# Patient Record
Sex: Female | Born: 1950
Health system: Southern US, Community
[De-identification: ages and names within clinical notes are randomized; demographics above are authoritative.]

## PROBLEM LIST (undated history)

## (undated) DIAGNOSIS — D352 Benign neoplasm of pituitary gland: Secondary | ICD-10-CM

## (undated) DIAGNOSIS — D649 Anemia, unspecified: Secondary | ICD-10-CM

## (undated) DIAGNOSIS — E785 Hyperlipidemia, unspecified: Secondary | ICD-10-CM

## (undated) DIAGNOSIS — F419 Anxiety disorder, unspecified: Secondary | ICD-10-CM

## (undated) DIAGNOSIS — G2581 Restless legs syndrome: Secondary | ICD-10-CM

## (undated) DIAGNOSIS — Z860100 Personal history of colon polyps, unspecified: Secondary | ICD-10-CM

## (undated) DIAGNOSIS — K219 Gastro-esophageal reflux disease without esophagitis: Secondary | ICD-10-CM

## (undated) DIAGNOSIS — J309 Allergic rhinitis, unspecified: Secondary | ICD-10-CM

## (undated) DIAGNOSIS — Z8371 Family history of colonic polyps: Secondary | ICD-10-CM

## (undated) DIAGNOSIS — J449 Chronic obstructive pulmonary disease, unspecified: Secondary | ICD-10-CM

## (undated) DIAGNOSIS — Z83719 Family history of colon polyps, unspecified: Secondary | ICD-10-CM

## (undated) DIAGNOSIS — K5090 Crohn's disease, unspecified, without complications: Secondary | ICD-10-CM

## (undated) DIAGNOSIS — Z8619 Personal history of other infectious and parasitic diseases: Secondary | ICD-10-CM

## (undated) DIAGNOSIS — K449 Diaphragmatic hernia without obstruction or gangrene: Secondary | ICD-10-CM

## (undated) DIAGNOSIS — R1319 Other dysphagia: Secondary | ICD-10-CM

## (undated) HISTORY — DX: Chronic obstructive pulmonary disease, unspecified: J44.9

## (undated) HISTORY — DX: Anxiety disorder, unspecified: F41.9

## (undated) HISTORY — PX: TUBAL LIGATION: SHX77

## (undated) HISTORY — DX: Restless legs syndrome: G25.81

## (undated) HISTORY — DX: Anemia, unspecified: D64.9

## (undated) HISTORY — DX: Personal history of colon polyps, unspecified: Z86.0100

## (undated) HISTORY — DX: Family history of colon polyps, unspecified: Z83.719

## (undated) HISTORY — DX: Family history of colonic polyps: Z83.71

## (undated) HISTORY — DX: Hyperlipidemia, unspecified: E78.5

## (undated) HISTORY — PX: TONSILLECTOMY: SUR1361

## (undated) HISTORY — DX: Allergic rhinitis, unspecified: J30.9

## (undated) HISTORY — DX: Benign neoplasm of pituitary gland: D35.2

## (undated) HISTORY — DX: Other dysphagia: R13.19

## (undated) HISTORY — DX: Diaphragmatic hernia without obstruction or gangrene: K44.9

## (undated) HISTORY — DX: Personal history of colonic polyps: Z86.010

## (undated) HISTORY — DX: Gastro-esophageal reflux disease without esophagitis: K21.9

## (undated) HISTORY — PX: GALLBLADDER SURGERY: SHX652

## (undated) HISTORY — DX: Personal history of other infectious and parasitic diseases: Z86.19

## (undated) HISTORY — DX: Crohn's disease, unspecified, without complications: K50.90

---

## 2016-02-29 DIAGNOSIS — J069 Acute upper respiratory infection, unspecified: Secondary | ICD-10-CM | POA: Diagnosis not present

## 2016-02-29 DIAGNOSIS — J449 Chronic obstructive pulmonary disease, unspecified: Secondary | ICD-10-CM | POA: Diagnosis not present

## 2016-02-29 DIAGNOSIS — M810 Age-related osteoporosis without current pathological fracture: Secondary | ICD-10-CM | POA: Diagnosis not present

## 2016-02-29 DIAGNOSIS — J018 Other acute sinusitis: Secondary | ICD-10-CM | POA: Diagnosis not present

## 2016-03-11 DIAGNOSIS — I10 Essential (primary) hypertension: Secondary | ICD-10-CM | POA: Diagnosis not present

## 2016-03-11 DIAGNOSIS — M810 Age-related osteoporosis without current pathological fracture: Secondary | ICD-10-CM | POA: Diagnosis not present

## 2016-03-11 DIAGNOSIS — J449 Chronic obstructive pulmonary disease, unspecified: Secondary | ICD-10-CM | POA: Diagnosis not present

## 2016-03-11 DIAGNOSIS — D518 Other vitamin B12 deficiency anemias: Secondary | ICD-10-CM | POA: Diagnosis not present

## 2016-03-18 DIAGNOSIS — E782 Mixed hyperlipidemia: Secondary | ICD-10-CM | POA: Diagnosis not present

## 2016-03-18 DIAGNOSIS — R7301 Impaired fasting glucose: Secondary | ICD-10-CM | POA: Diagnosis not present

## 2016-03-18 DIAGNOSIS — J069 Acute upper respiratory infection, unspecified: Secondary | ICD-10-CM | POA: Diagnosis not present

## 2016-03-18 DIAGNOSIS — R5383 Other fatigue: Secondary | ICD-10-CM | POA: Diagnosis not present

## 2016-04-21 DIAGNOSIS — Z1211 Encounter for screening for malignant neoplasm of colon: Secondary | ICD-10-CM | POA: Diagnosis not present

## 2016-04-21 DIAGNOSIS — Z86010 Personal history of colonic polyps: Secondary | ICD-10-CM | POA: Diagnosis not present

## 2016-04-21 DIAGNOSIS — R1310 Dysphagia, unspecified: Secondary | ICD-10-CM | POA: Diagnosis not present

## 2016-04-21 DIAGNOSIS — K219 Gastro-esophageal reflux disease without esophagitis: Secondary | ICD-10-CM | POA: Diagnosis not present

## 2016-04-28 DIAGNOSIS — K449 Diaphragmatic hernia without obstruction or gangrene: Secondary | ICD-10-CM | POA: Diagnosis not present

## 2016-04-28 DIAGNOSIS — R1310 Dysphagia, unspecified: Secondary | ICD-10-CM | POA: Diagnosis not present

## 2016-05-26 DIAGNOSIS — I10 Essential (primary) hypertension: Secondary | ICD-10-CM | POA: Diagnosis not present

## 2016-05-26 DIAGNOSIS — M810 Age-related osteoporosis without current pathological fracture: Secondary | ICD-10-CM | POA: Diagnosis not present

## 2016-05-26 DIAGNOSIS — D518 Other vitamin B12 deficiency anemias: Secondary | ICD-10-CM | POA: Diagnosis not present

## 2016-05-26 DIAGNOSIS — J449 Chronic obstructive pulmonary disease, unspecified: Secondary | ICD-10-CM | POA: Diagnosis not present

## 2016-06-02 DIAGNOSIS — M810 Age-related osteoporosis without current pathological fracture: Secondary | ICD-10-CM | POA: Diagnosis not present

## 2016-06-02 DIAGNOSIS — E119 Type 2 diabetes mellitus without complications: Secondary | ICD-10-CM | POA: Diagnosis not present

## 2016-06-02 DIAGNOSIS — J449 Chronic obstructive pulmonary disease, unspecified: Secondary | ICD-10-CM | POA: Diagnosis not present

## 2016-06-02 DIAGNOSIS — R0602 Shortness of breath: Secondary | ICD-10-CM | POA: Diagnosis not present

## 2016-06-02 DIAGNOSIS — I10 Essential (primary) hypertension: Secondary | ICD-10-CM | POA: Diagnosis not present

## 2016-06-02 DIAGNOSIS — E038 Other specified hypothyroidism: Secondary | ICD-10-CM | POA: Diagnosis not present

## 2016-06-02 DIAGNOSIS — D518 Other vitamin B12 deficiency anemias: Secondary | ICD-10-CM | POA: Diagnosis not present

## 2016-06-02 DIAGNOSIS — E559 Vitamin D deficiency, unspecified: Secondary | ICD-10-CM | POA: Diagnosis not present

## 2016-06-02 DIAGNOSIS — E782 Mixed hyperlipidemia: Secondary | ICD-10-CM | POA: Diagnosis not present

## 2016-06-24 DIAGNOSIS — I10 Essential (primary) hypertension: Secondary | ICD-10-CM | POA: Diagnosis not present

## 2016-06-24 DIAGNOSIS — D518 Other vitamin B12 deficiency anemias: Secondary | ICD-10-CM | POA: Diagnosis not present

## 2016-06-24 DIAGNOSIS — J449 Chronic obstructive pulmonary disease, unspecified: Secondary | ICD-10-CM | POA: Diagnosis not present

## 2016-06-24 DIAGNOSIS — M810 Age-related osteoporosis without current pathological fracture: Secondary | ICD-10-CM | POA: Diagnosis not present

## 2016-07-02 DIAGNOSIS — R69 Illness, unspecified: Secondary | ICD-10-CM | POA: Diagnosis not present

## 2016-07-02 DIAGNOSIS — M810 Age-related osteoporosis without current pathological fracture: Secondary | ICD-10-CM | POA: Diagnosis not present

## 2016-07-02 DIAGNOSIS — Z7951 Long term (current) use of inhaled steroids: Secondary | ICD-10-CM | POA: Diagnosis not present

## 2016-07-02 DIAGNOSIS — K219 Gastro-esophageal reflux disease without esophagitis: Secondary | ICD-10-CM | POA: Diagnosis not present

## 2016-07-02 DIAGNOSIS — Z87891 Personal history of nicotine dependence: Secondary | ICD-10-CM | POA: Diagnosis not present

## 2016-07-02 DIAGNOSIS — Z0000 Encounter for general adult medical examination without abnormal findings: Secondary | ICD-10-CM | POA: Diagnosis not present

## 2016-07-02 DIAGNOSIS — R233 Spontaneous ecchymoses: Secondary | ICD-10-CM | POA: Diagnosis not present

## 2016-07-02 DIAGNOSIS — H5462 Unqualified visual loss, left eye, normal vision right eye: Secondary | ICD-10-CM | POA: Diagnosis not present

## 2016-07-02 DIAGNOSIS — Z681 Body mass index (BMI) 19 or less, adult: Secondary | ICD-10-CM | POA: Diagnosis not present

## 2016-07-02 DIAGNOSIS — J449 Chronic obstructive pulmonary disease, unspecified: Secondary | ICD-10-CM | POA: Diagnosis not present

## 2016-07-02 DIAGNOSIS — Z9049 Acquired absence of other specified parts of digestive tract: Secondary | ICD-10-CM | POA: Diagnosis not present

## 2016-07-02 DIAGNOSIS — M791 Myalgia: Secondary | ICD-10-CM | POA: Diagnosis not present

## 2016-07-13 DIAGNOSIS — Z9181 History of falling: Secondary | ICD-10-CM | POA: Diagnosis not present

## 2016-07-13 DIAGNOSIS — M810 Age-related osteoporosis without current pathological fracture: Secondary | ICD-10-CM | POA: Diagnosis not present

## 2016-07-13 DIAGNOSIS — Z681 Body mass index (BMI) 19 or less, adult: Secondary | ICD-10-CM | POA: Diagnosis not present

## 2016-07-13 DIAGNOSIS — J432 Centrilobular emphysema: Secondary | ICD-10-CM | POA: Diagnosis not present

## 2016-07-13 DIAGNOSIS — Z1389 Encounter for screening for other disorder: Secondary | ICD-10-CM | POA: Diagnosis not present

## 2016-07-13 DIAGNOSIS — R69 Illness, unspecified: Secondary | ICD-10-CM | POA: Diagnosis not present

## 2016-07-13 DIAGNOSIS — K219 Gastro-esophageal reflux disease without esophagitis: Secondary | ICD-10-CM | POA: Diagnosis not present

## 2016-07-21 DIAGNOSIS — E785 Hyperlipidemia, unspecified: Secondary | ICD-10-CM | POA: Diagnosis not present

## 2016-07-21 DIAGNOSIS — R1310 Dysphagia, unspecified: Secondary | ICD-10-CM | POA: Diagnosis not present

## 2016-07-21 DIAGNOSIS — K5090 Crohn's disease, unspecified, without complications: Secondary | ICD-10-CM | POA: Diagnosis not present

## 2016-07-21 DIAGNOSIS — K225 Diverticulum of esophagus, acquired: Secondary | ICD-10-CM | POA: Diagnosis not present

## 2016-07-21 DIAGNOSIS — K222 Esophageal obstruction: Secondary | ICD-10-CM | POA: Diagnosis not present

## 2016-07-21 DIAGNOSIS — D122 Benign neoplasm of ascending colon: Secondary | ICD-10-CM | POA: Diagnosis not present

## 2016-07-21 DIAGNOSIS — K635 Polyp of colon: Secondary | ICD-10-CM | POA: Diagnosis not present

## 2016-07-21 DIAGNOSIS — J449 Chronic obstructive pulmonary disease, unspecified: Secondary | ICD-10-CM | POA: Diagnosis not present

## 2016-07-21 DIAGNOSIS — K648 Other hemorrhoids: Secondary | ICD-10-CM | POA: Diagnosis not present

## 2016-07-21 DIAGNOSIS — K5710 Diverticulosis of small intestine without perforation or abscess without bleeding: Secondary | ICD-10-CM | POA: Diagnosis not present

## 2016-07-21 DIAGNOSIS — Z86010 Personal history of colonic polyps: Secondary | ICD-10-CM | POA: Diagnosis not present

## 2016-07-21 DIAGNOSIS — Z1211 Encounter for screening for malignant neoplasm of colon: Secondary | ICD-10-CM | POA: Diagnosis not present

## 2016-07-21 DIAGNOSIS — K449 Diaphragmatic hernia without obstruction or gangrene: Secondary | ICD-10-CM | POA: Diagnosis not present

## 2016-07-21 DIAGNOSIS — K219 Gastro-esophageal reflux disease without esophagitis: Secondary | ICD-10-CM | POA: Diagnosis not present

## 2016-07-21 DIAGNOSIS — K529 Noninfective gastroenteritis and colitis, unspecified: Secondary | ICD-10-CM | POA: Diagnosis not present

## 2016-07-21 DIAGNOSIS — K5000 Crohn's disease of small intestine without complications: Secondary | ICD-10-CM | POA: Diagnosis not present

## 2016-07-21 HISTORY — PX: COLONOSCOPY: SHX174

## 2016-07-21 HISTORY — PX: ESOPHAGOGASTRODUODENOSCOPY: SHX1529

## 2016-07-27 DIAGNOSIS — J449 Chronic obstructive pulmonary disease, unspecified: Secondary | ICD-10-CM | POA: Diagnosis not present

## 2016-07-27 DIAGNOSIS — D518 Other vitamin B12 deficiency anemias: Secondary | ICD-10-CM | POA: Diagnosis not present

## 2016-07-27 DIAGNOSIS — I10 Essential (primary) hypertension: Secondary | ICD-10-CM | POA: Diagnosis not present

## 2016-07-27 DIAGNOSIS — M810 Age-related osteoporosis without current pathological fracture: Secondary | ICD-10-CM | POA: Diagnosis not present

## 2016-08-11 DIAGNOSIS — Z0000 Encounter for general adult medical examination without abnormal findings: Secondary | ICD-10-CM | POA: Diagnosis not present

## 2016-08-11 DIAGNOSIS — R69 Illness, unspecified: Secondary | ICD-10-CM | POA: Diagnosis not present

## 2016-08-11 DIAGNOSIS — Z6820 Body mass index (BMI) 20.0-20.9, adult: Secondary | ICD-10-CM | POA: Diagnosis not present

## 2016-08-11 DIAGNOSIS — K56699 Other intestinal obstruction unspecified as to partial versus complete obstruction: Secondary | ICD-10-CM | POA: Diagnosis not present

## 2016-08-11 DIAGNOSIS — M810 Age-related osteoporosis without current pathological fracture: Secondary | ICD-10-CM | POA: Diagnosis not present

## 2016-08-11 DIAGNOSIS — K219 Gastro-esophageal reflux disease without esophagitis: Secondary | ICD-10-CM | POA: Diagnosis not present

## 2016-08-11 DIAGNOSIS — K449 Diaphragmatic hernia without obstruction or gangrene: Secondary | ICD-10-CM | POA: Diagnosis not present

## 2016-08-11 DIAGNOSIS — J432 Centrilobular emphysema: Secondary | ICD-10-CM | POA: Diagnosis not present

## 2016-08-11 DIAGNOSIS — Z780 Asymptomatic menopausal state: Secondary | ICD-10-CM | POA: Diagnosis not present

## 2016-08-25 DIAGNOSIS — K5090 Crohn's disease, unspecified, without complications: Secondary | ICD-10-CM | POA: Diagnosis not present

## 2016-09-12 DIAGNOSIS — K50018 Crohn's disease of small intestine with other complication: Secondary | ICD-10-CM | POA: Insufficient documentation

## 2016-11-12 DIAGNOSIS — R69 Illness, unspecified: Secondary | ICD-10-CM | POA: Diagnosis not present

## 2016-11-17 DIAGNOSIS — M5442 Lumbago with sciatica, left side: Secondary | ICD-10-CM | POA: Diagnosis not present

## 2016-11-17 DIAGNOSIS — M62830 Muscle spasm of back: Secondary | ICD-10-CM | POA: Diagnosis not present

## 2016-11-17 DIAGNOSIS — M5137 Other intervertebral disc degeneration, lumbosacral region: Secondary | ICD-10-CM | POA: Diagnosis not present

## 2016-11-17 DIAGNOSIS — H5203 Hypermetropia, bilateral: Secondary | ICD-10-CM | POA: Diagnosis not present

## 2016-11-17 DIAGNOSIS — M9903 Segmental and somatic dysfunction of lumbar region: Secondary | ICD-10-CM | POA: Diagnosis not present

## 2016-11-17 DIAGNOSIS — H524 Presbyopia: Secondary | ICD-10-CM | POA: Diagnosis not present

## 2016-11-17 DIAGNOSIS — H52209 Unspecified astigmatism, unspecified eye: Secondary | ICD-10-CM | POA: Diagnosis not present

## 2016-11-17 DIAGNOSIS — M5136 Other intervertebral disc degeneration, lumbar region: Secondary | ICD-10-CM | POA: Diagnosis not present

## 2016-11-17 DIAGNOSIS — M9902 Segmental and somatic dysfunction of thoracic region: Secondary | ICD-10-CM | POA: Diagnosis not present

## 2016-11-24 DIAGNOSIS — M9902 Segmental and somatic dysfunction of thoracic region: Secondary | ICD-10-CM | POA: Diagnosis not present

## 2016-11-24 DIAGNOSIS — M9903 Segmental and somatic dysfunction of lumbar region: Secondary | ICD-10-CM | POA: Diagnosis not present

## 2016-11-24 DIAGNOSIS — M5137 Other intervertebral disc degeneration, lumbosacral region: Secondary | ICD-10-CM | POA: Diagnosis not present

## 2016-11-24 DIAGNOSIS — M62830 Muscle spasm of back: Secondary | ICD-10-CM | POA: Diagnosis not present

## 2016-11-24 DIAGNOSIS — M5442 Lumbago with sciatica, left side: Secondary | ICD-10-CM | POA: Diagnosis not present

## 2016-11-24 DIAGNOSIS — M5136 Other intervertebral disc degeneration, lumbar region: Secondary | ICD-10-CM | POA: Diagnosis not present

## 2016-11-28 DIAGNOSIS — M62830 Muscle spasm of back: Secondary | ICD-10-CM | POA: Diagnosis not present

## 2016-11-28 DIAGNOSIS — M9903 Segmental and somatic dysfunction of lumbar region: Secondary | ICD-10-CM | POA: Diagnosis not present

## 2016-11-28 DIAGNOSIS — M5136 Other intervertebral disc degeneration, lumbar region: Secondary | ICD-10-CM | POA: Diagnosis not present

## 2016-11-28 DIAGNOSIS — M5137 Other intervertebral disc degeneration, lumbosacral region: Secondary | ICD-10-CM | POA: Diagnosis not present

## 2016-11-28 DIAGNOSIS — M5442 Lumbago with sciatica, left side: Secondary | ICD-10-CM | POA: Diagnosis not present

## 2016-11-28 DIAGNOSIS — M9902 Segmental and somatic dysfunction of thoracic region: Secondary | ICD-10-CM | POA: Diagnosis not present

## 2016-11-29 DIAGNOSIS — M5442 Lumbago with sciatica, left side: Secondary | ICD-10-CM | POA: Diagnosis not present

## 2016-11-29 DIAGNOSIS — M5136 Other intervertebral disc degeneration, lumbar region: Secondary | ICD-10-CM | POA: Diagnosis not present

## 2016-11-29 DIAGNOSIS — M9903 Segmental and somatic dysfunction of lumbar region: Secondary | ICD-10-CM | POA: Diagnosis not present

## 2016-11-29 DIAGNOSIS — M62830 Muscle spasm of back: Secondary | ICD-10-CM | POA: Diagnosis not present

## 2016-11-29 DIAGNOSIS — M9902 Segmental and somatic dysfunction of thoracic region: Secondary | ICD-10-CM | POA: Diagnosis not present

## 2016-11-29 DIAGNOSIS — M5137 Other intervertebral disc degeneration, lumbosacral region: Secondary | ICD-10-CM | POA: Diagnosis not present

## 2016-12-01 DIAGNOSIS — M9903 Segmental and somatic dysfunction of lumbar region: Secondary | ICD-10-CM | POA: Diagnosis not present

## 2016-12-01 DIAGNOSIS — M5442 Lumbago with sciatica, left side: Secondary | ICD-10-CM | POA: Diagnosis not present

## 2016-12-01 DIAGNOSIS — M9902 Segmental and somatic dysfunction of thoracic region: Secondary | ICD-10-CM | POA: Diagnosis not present

## 2016-12-01 DIAGNOSIS — M5136 Other intervertebral disc degeneration, lumbar region: Secondary | ICD-10-CM | POA: Diagnosis not present

## 2016-12-01 DIAGNOSIS — M5137 Other intervertebral disc degeneration, lumbosacral region: Secondary | ICD-10-CM | POA: Diagnosis not present

## 2016-12-01 DIAGNOSIS — M62830 Muscle spasm of back: Secondary | ICD-10-CM | POA: Diagnosis not present

## 2016-12-02 DIAGNOSIS — R69 Illness, unspecified: Secondary | ICD-10-CM | POA: Diagnosis not present

## 2016-12-02 DIAGNOSIS — N3281 Overactive bladder: Secondary | ICD-10-CM | POA: Diagnosis not present

## 2016-12-02 DIAGNOSIS — Z139 Encounter for screening, unspecified: Secondary | ICD-10-CM | POA: Diagnosis not present

## 2016-12-02 DIAGNOSIS — K219 Gastro-esophageal reflux disease without esophagitis: Secondary | ICD-10-CM | POA: Diagnosis not present

## 2016-12-02 DIAGNOSIS — Z6820 Body mass index (BMI) 20.0-20.9, adult: Secondary | ICD-10-CM | POA: Diagnosis not present

## 2016-12-02 DIAGNOSIS — M810 Age-related osteoporosis without current pathological fracture: Secondary | ICD-10-CM | POA: Diagnosis not present

## 2016-12-02 DIAGNOSIS — J432 Centrilobular emphysema: Secondary | ICD-10-CM | POA: Diagnosis not present

## 2016-12-05 DIAGNOSIS — M9902 Segmental and somatic dysfunction of thoracic region: Secondary | ICD-10-CM | POA: Diagnosis not present

## 2016-12-05 DIAGNOSIS — M9903 Segmental and somatic dysfunction of lumbar region: Secondary | ICD-10-CM | POA: Diagnosis not present

## 2016-12-05 DIAGNOSIS — M5137 Other intervertebral disc degeneration, lumbosacral region: Secondary | ICD-10-CM | POA: Diagnosis not present

## 2016-12-05 DIAGNOSIS — M5136 Other intervertebral disc degeneration, lumbar region: Secondary | ICD-10-CM | POA: Diagnosis not present

## 2016-12-05 DIAGNOSIS — M62830 Muscle spasm of back: Secondary | ICD-10-CM | POA: Diagnosis not present

## 2016-12-05 DIAGNOSIS — M5442 Lumbago with sciatica, left side: Secondary | ICD-10-CM | POA: Diagnosis not present

## 2016-12-06 DIAGNOSIS — M5442 Lumbago with sciatica, left side: Secondary | ICD-10-CM | POA: Diagnosis not present

## 2016-12-06 DIAGNOSIS — M9902 Segmental and somatic dysfunction of thoracic region: Secondary | ICD-10-CM | POA: Diagnosis not present

## 2016-12-06 DIAGNOSIS — M9903 Segmental and somatic dysfunction of lumbar region: Secondary | ICD-10-CM | POA: Diagnosis not present

## 2016-12-06 DIAGNOSIS — E559 Vitamin D deficiency, unspecified: Secondary | ICD-10-CM | POA: Diagnosis not present

## 2016-12-06 DIAGNOSIS — Z79899 Other long term (current) drug therapy: Secondary | ICD-10-CM | POA: Diagnosis not present

## 2016-12-06 DIAGNOSIS — M5136 Other intervertebral disc degeneration, lumbar region: Secondary | ICD-10-CM | POA: Diagnosis not present

## 2016-12-06 DIAGNOSIS — Z0000 Encounter for general adult medical examination without abnormal findings: Secondary | ICD-10-CM | POA: Diagnosis not present

## 2016-12-06 DIAGNOSIS — M62830 Muscle spasm of back: Secondary | ICD-10-CM | POA: Diagnosis not present

## 2016-12-06 DIAGNOSIS — M5137 Other intervertebral disc degeneration, lumbosacral region: Secondary | ICD-10-CM | POA: Diagnosis not present

## 2016-12-08 DIAGNOSIS — M9903 Segmental and somatic dysfunction of lumbar region: Secondary | ICD-10-CM | POA: Diagnosis not present

## 2016-12-08 DIAGNOSIS — M5442 Lumbago with sciatica, left side: Secondary | ICD-10-CM | POA: Diagnosis not present

## 2016-12-08 DIAGNOSIS — M5137 Other intervertebral disc degeneration, lumbosacral region: Secondary | ICD-10-CM | POA: Diagnosis not present

## 2016-12-08 DIAGNOSIS — M5136 Other intervertebral disc degeneration, lumbar region: Secondary | ICD-10-CM | POA: Diagnosis not present

## 2016-12-08 DIAGNOSIS — M9902 Segmental and somatic dysfunction of thoracic region: Secondary | ICD-10-CM | POA: Diagnosis not present

## 2016-12-08 DIAGNOSIS — M62830 Muscle spasm of back: Secondary | ICD-10-CM | POA: Diagnosis not present

## 2016-12-12 DIAGNOSIS — M5442 Lumbago with sciatica, left side: Secondary | ICD-10-CM | POA: Diagnosis not present

## 2016-12-12 DIAGNOSIS — M9902 Segmental and somatic dysfunction of thoracic region: Secondary | ICD-10-CM | POA: Diagnosis not present

## 2016-12-12 DIAGNOSIS — M5137 Other intervertebral disc degeneration, lumbosacral region: Secondary | ICD-10-CM | POA: Diagnosis not present

## 2016-12-12 DIAGNOSIS — M5136 Other intervertebral disc degeneration, lumbar region: Secondary | ICD-10-CM | POA: Diagnosis not present

## 2016-12-12 DIAGNOSIS — M9903 Segmental and somatic dysfunction of lumbar region: Secondary | ICD-10-CM | POA: Diagnosis not present

## 2016-12-12 DIAGNOSIS — M62830 Muscle spasm of back: Secondary | ICD-10-CM | POA: Diagnosis not present

## 2016-12-13 DIAGNOSIS — M9902 Segmental and somatic dysfunction of thoracic region: Secondary | ICD-10-CM | POA: Diagnosis not present

## 2016-12-13 DIAGNOSIS — M5137 Other intervertebral disc degeneration, lumbosacral region: Secondary | ICD-10-CM | POA: Diagnosis not present

## 2016-12-13 DIAGNOSIS — M62830 Muscle spasm of back: Secondary | ICD-10-CM | POA: Diagnosis not present

## 2016-12-13 DIAGNOSIS — M5442 Lumbago with sciatica, left side: Secondary | ICD-10-CM | POA: Diagnosis not present

## 2016-12-13 DIAGNOSIS — M5136 Other intervertebral disc degeneration, lumbar region: Secondary | ICD-10-CM | POA: Diagnosis not present

## 2016-12-13 DIAGNOSIS — M9903 Segmental and somatic dysfunction of lumbar region: Secondary | ICD-10-CM | POA: Diagnosis not present

## 2016-12-15 DIAGNOSIS — M9903 Segmental and somatic dysfunction of lumbar region: Secondary | ICD-10-CM | POA: Diagnosis not present

## 2016-12-15 DIAGNOSIS — M5137 Other intervertebral disc degeneration, lumbosacral region: Secondary | ICD-10-CM | POA: Diagnosis not present

## 2016-12-15 DIAGNOSIS — M62830 Muscle spasm of back: Secondary | ICD-10-CM | POA: Diagnosis not present

## 2016-12-15 DIAGNOSIS — M9902 Segmental and somatic dysfunction of thoracic region: Secondary | ICD-10-CM | POA: Diagnosis not present

## 2016-12-15 DIAGNOSIS — M5136 Other intervertebral disc degeneration, lumbar region: Secondary | ICD-10-CM | POA: Diagnosis not present

## 2016-12-15 DIAGNOSIS — M5442 Lumbago with sciatica, left side: Secondary | ICD-10-CM | POA: Diagnosis not present

## 2016-12-19 DIAGNOSIS — M62830 Muscle spasm of back: Secondary | ICD-10-CM | POA: Diagnosis not present

## 2016-12-19 DIAGNOSIS — M5136 Other intervertebral disc degeneration, lumbar region: Secondary | ICD-10-CM | POA: Diagnosis not present

## 2016-12-19 DIAGNOSIS — M9903 Segmental and somatic dysfunction of lumbar region: Secondary | ICD-10-CM | POA: Diagnosis not present

## 2016-12-19 DIAGNOSIS — M5137 Other intervertebral disc degeneration, lumbosacral region: Secondary | ICD-10-CM | POA: Diagnosis not present

## 2016-12-19 DIAGNOSIS — M9902 Segmental and somatic dysfunction of thoracic region: Secondary | ICD-10-CM | POA: Diagnosis not present

## 2016-12-19 DIAGNOSIS — M5442 Lumbago with sciatica, left side: Secondary | ICD-10-CM | POA: Diagnosis not present

## 2016-12-20 DIAGNOSIS — M5442 Lumbago with sciatica, left side: Secondary | ICD-10-CM | POA: Diagnosis not present

## 2016-12-20 DIAGNOSIS — M9902 Segmental and somatic dysfunction of thoracic region: Secondary | ICD-10-CM | POA: Diagnosis not present

## 2016-12-20 DIAGNOSIS — M5136 Other intervertebral disc degeneration, lumbar region: Secondary | ICD-10-CM | POA: Diagnosis not present

## 2016-12-20 DIAGNOSIS — M62830 Muscle spasm of back: Secondary | ICD-10-CM | POA: Diagnosis not present

## 2016-12-20 DIAGNOSIS — M9903 Segmental and somatic dysfunction of lumbar region: Secondary | ICD-10-CM | POA: Diagnosis not present

## 2016-12-20 DIAGNOSIS — M5137 Other intervertebral disc degeneration, lumbosacral region: Secondary | ICD-10-CM | POA: Diagnosis not present

## 2016-12-22 DIAGNOSIS — M5136 Other intervertebral disc degeneration, lumbar region: Secondary | ICD-10-CM | POA: Diagnosis not present

## 2016-12-22 DIAGNOSIS — M5137 Other intervertebral disc degeneration, lumbosacral region: Secondary | ICD-10-CM | POA: Diagnosis not present

## 2016-12-22 DIAGNOSIS — M5442 Lumbago with sciatica, left side: Secondary | ICD-10-CM | POA: Diagnosis not present

## 2016-12-22 DIAGNOSIS — M9902 Segmental and somatic dysfunction of thoracic region: Secondary | ICD-10-CM | POA: Diagnosis not present

## 2016-12-22 DIAGNOSIS — M62830 Muscle spasm of back: Secondary | ICD-10-CM | POA: Diagnosis not present

## 2016-12-22 DIAGNOSIS — M9903 Segmental and somatic dysfunction of lumbar region: Secondary | ICD-10-CM | POA: Diagnosis not present

## 2016-12-26 DIAGNOSIS — M9902 Segmental and somatic dysfunction of thoracic region: Secondary | ICD-10-CM | POA: Diagnosis not present

## 2016-12-26 DIAGNOSIS — M9903 Segmental and somatic dysfunction of lumbar region: Secondary | ICD-10-CM | POA: Diagnosis not present

## 2016-12-26 DIAGNOSIS — M5136 Other intervertebral disc degeneration, lumbar region: Secondary | ICD-10-CM | POA: Diagnosis not present

## 2016-12-26 DIAGNOSIS — M5137 Other intervertebral disc degeneration, lumbosacral region: Secondary | ICD-10-CM | POA: Diagnosis not present

## 2016-12-26 DIAGNOSIS — M62830 Muscle spasm of back: Secondary | ICD-10-CM | POA: Diagnosis not present

## 2016-12-26 DIAGNOSIS — M5442 Lumbago with sciatica, left side: Secondary | ICD-10-CM | POA: Diagnosis not present

## 2016-12-27 DIAGNOSIS — M5137 Other intervertebral disc degeneration, lumbosacral region: Secondary | ICD-10-CM | POA: Diagnosis not present

## 2016-12-27 DIAGNOSIS — M9902 Segmental and somatic dysfunction of thoracic region: Secondary | ICD-10-CM | POA: Diagnosis not present

## 2016-12-27 DIAGNOSIS — M62830 Muscle spasm of back: Secondary | ICD-10-CM | POA: Diagnosis not present

## 2016-12-27 DIAGNOSIS — M5136 Other intervertebral disc degeneration, lumbar region: Secondary | ICD-10-CM | POA: Diagnosis not present

## 2016-12-27 DIAGNOSIS — M5442 Lumbago with sciatica, left side: Secondary | ICD-10-CM | POA: Diagnosis not present

## 2016-12-27 DIAGNOSIS — M9903 Segmental and somatic dysfunction of lumbar region: Secondary | ICD-10-CM | POA: Diagnosis not present

## 2017-01-12 DIAGNOSIS — L300 Nummular dermatitis: Secondary | ICD-10-CM | POA: Diagnosis not present

## 2017-01-12 DIAGNOSIS — L814 Other melanin hyperpigmentation: Secondary | ICD-10-CM | POA: Diagnosis not present

## 2017-01-12 DIAGNOSIS — D225 Melanocytic nevi of trunk: Secondary | ICD-10-CM | POA: Diagnosis not present

## 2017-01-26 DIAGNOSIS — R69 Illness, unspecified: Secondary | ICD-10-CM | POA: Diagnosis not present

## 2017-03-03 DIAGNOSIS — Z1231 Encounter for screening mammogram for malignant neoplasm of breast: Secondary | ICD-10-CM | POA: Diagnosis not present

## 2017-03-15 DIAGNOSIS — R69 Illness, unspecified: Secondary | ICD-10-CM | POA: Diagnosis not present

## 2017-03-15 DIAGNOSIS — J441 Chronic obstructive pulmonary disease with (acute) exacerbation: Secondary | ICD-10-CM | POA: Diagnosis not present

## 2017-03-15 DIAGNOSIS — I70219 Atherosclerosis of native arteries of extremities with intermittent claudication, unspecified extremity: Secondary | ICD-10-CM | POA: Diagnosis not present

## 2017-04-13 DIAGNOSIS — Z6820 Body mass index (BMI) 20.0-20.9, adult: Secondary | ICD-10-CM | POA: Diagnosis not present

## 2017-04-13 DIAGNOSIS — R55 Syncope and collapse: Secondary | ICD-10-CM | POA: Diagnosis not present

## 2017-04-13 DIAGNOSIS — M810 Age-related osteoporosis without current pathological fracture: Secondary | ICD-10-CM | POA: Diagnosis not present

## 2017-05-16 DIAGNOSIS — J432 Centrilobular emphysema: Secondary | ICD-10-CM | POA: Diagnosis not present

## 2017-05-16 DIAGNOSIS — K219 Gastro-esophageal reflux disease without esophagitis: Secondary | ICD-10-CM | POA: Diagnosis not present

## 2017-05-16 DIAGNOSIS — I70219 Atherosclerosis of native arteries of extremities with intermittent claudication, unspecified extremity: Secondary | ICD-10-CM | POA: Diagnosis not present

## 2017-05-16 DIAGNOSIS — R69 Illness, unspecified: Secondary | ICD-10-CM | POA: Diagnosis not present

## 2017-05-16 DIAGNOSIS — Z681 Body mass index (BMI) 19 or less, adult: Secondary | ICD-10-CM | POA: Diagnosis not present

## 2017-05-16 DIAGNOSIS — K5090 Crohn's disease, unspecified, without complications: Secondary | ICD-10-CM | POA: Diagnosis not present

## 2017-05-16 DIAGNOSIS — E782 Mixed hyperlipidemia: Secondary | ICD-10-CM | POA: Diagnosis not present

## 2017-06-26 DIAGNOSIS — Z6820 Body mass index (BMI) 20.0-20.9, adult: Secondary | ICD-10-CM | POA: Diagnosis not present

## 2017-06-26 DIAGNOSIS — J441 Chronic obstructive pulmonary disease with (acute) exacerbation: Secondary | ICD-10-CM | POA: Diagnosis not present

## 2017-07-13 DIAGNOSIS — N390 Urinary tract infection, site not specified: Secondary | ICD-10-CM | POA: Diagnosis not present

## 2017-07-13 DIAGNOSIS — R0602 Shortness of breath: Secondary | ICD-10-CM | POA: Diagnosis not present

## 2017-07-13 DIAGNOSIS — D72825 Bandemia: Secondary | ICD-10-CM | POA: Diagnosis not present

## 2017-07-13 DIAGNOSIS — K5090 Crohn's disease, unspecified, without complications: Secondary | ICD-10-CM | POA: Diagnosis not present

## 2017-07-13 DIAGNOSIS — A419 Sepsis, unspecified organism: Secondary | ICD-10-CM | POA: Diagnosis not present

## 2017-07-13 DIAGNOSIS — E785 Hyperlipidemia, unspecified: Secondary | ICD-10-CM | POA: Diagnosis not present

## 2017-07-13 DIAGNOSIS — Z79899 Other long term (current) drug therapy: Secondary | ICD-10-CM | POA: Diagnosis not present

## 2017-07-13 DIAGNOSIS — E860 Dehydration: Secondary | ICD-10-CM | POA: Diagnosis not present

## 2017-07-13 DIAGNOSIS — R079 Chest pain, unspecified: Secondary | ICD-10-CM | POA: Diagnosis not present

## 2017-07-13 DIAGNOSIS — R69 Illness, unspecified: Secondary | ICD-10-CM | POA: Diagnosis not present

## 2017-07-13 DIAGNOSIS — R109 Unspecified abdominal pain: Secondary | ICD-10-CM | POA: Diagnosis not present

## 2017-07-13 DIAGNOSIS — R55 Syncope and collapse: Secondary | ICD-10-CM | POA: Diagnosis not present

## 2017-07-13 DIAGNOSIS — N3000 Acute cystitis without hematuria: Secondary | ICD-10-CM | POA: Diagnosis not present

## 2017-07-13 DIAGNOSIS — K219 Gastro-esophageal reflux disease without esophagitis: Secondary | ICD-10-CM | POA: Diagnosis not present

## 2017-07-13 DIAGNOSIS — J449 Chronic obstructive pulmonary disease, unspecified: Secondary | ICD-10-CM | POA: Diagnosis not present

## 2017-07-13 DIAGNOSIS — R0789 Other chest pain: Secondary | ICD-10-CM | POA: Diagnosis not present

## 2017-07-13 DIAGNOSIS — M810 Age-related osteoporosis without current pathological fracture: Secondary | ICD-10-CM | POA: Diagnosis not present

## 2017-07-14 DIAGNOSIS — N3000 Acute cystitis without hematuria: Secondary | ICD-10-CM | POA: Diagnosis not present

## 2017-07-14 DIAGNOSIS — J449 Chronic obstructive pulmonary disease, unspecified: Secondary | ICD-10-CM | POA: Diagnosis not present

## 2017-07-14 DIAGNOSIS — N390 Urinary tract infection, site not specified: Secondary | ICD-10-CM | POA: Diagnosis not present

## 2017-07-14 DIAGNOSIS — K219 Gastro-esophageal reflux disease without esophagitis: Secondary | ICD-10-CM | POA: Diagnosis not present

## 2017-07-14 DIAGNOSIS — R509 Fever, unspecified: Secondary | ICD-10-CM | POA: Diagnosis not present

## 2017-07-14 DIAGNOSIS — E785 Hyperlipidemia, unspecified: Secondary | ICD-10-CM | POA: Diagnosis not present

## 2017-07-14 DIAGNOSIS — Z79899 Other long term (current) drug therapy: Secondary | ICD-10-CM | POA: Diagnosis not present

## 2017-07-14 DIAGNOSIS — R69 Illness, unspecified: Secondary | ICD-10-CM | POA: Diagnosis not present

## 2017-07-14 DIAGNOSIS — M810 Age-related osteoporosis without current pathological fracture: Secondary | ICD-10-CM | POA: Diagnosis not present

## 2017-07-14 DIAGNOSIS — R55 Syncope and collapse: Secondary | ICD-10-CM | POA: Diagnosis not present

## 2017-07-14 DIAGNOSIS — I517 Cardiomegaly: Secondary | ICD-10-CM

## 2017-07-14 DIAGNOSIS — A419 Sepsis, unspecified organism: Secondary | ICD-10-CM | POA: Diagnosis not present

## 2017-07-14 DIAGNOSIS — K5090 Crohn's disease, unspecified, without complications: Secondary | ICD-10-CM | POA: Diagnosis not present

## 2017-07-15 DIAGNOSIS — N390 Urinary tract infection, site not specified: Secondary | ICD-10-CM | POA: Diagnosis not present

## 2017-07-15 DIAGNOSIS — R55 Syncope and collapse: Secondary | ICD-10-CM | POA: Diagnosis not present

## 2017-08-03 DIAGNOSIS — R69 Illness, unspecified: Secondary | ICD-10-CM | POA: Diagnosis not present

## 2017-08-15 DIAGNOSIS — J432 Centrilobular emphysema: Secondary | ICD-10-CM | POA: Diagnosis not present

## 2017-08-15 DIAGNOSIS — Z79899 Other long term (current) drug therapy: Secondary | ICD-10-CM | POA: Diagnosis not present

## 2017-08-15 DIAGNOSIS — I70219 Atherosclerosis of native arteries of extremities with intermittent claudication, unspecified extremity: Secondary | ICD-10-CM | POA: Diagnosis not present

## 2017-08-15 DIAGNOSIS — Z6820 Body mass index (BMI) 20.0-20.9, adult: Secondary | ICD-10-CM | POA: Diagnosis not present

## 2017-08-15 DIAGNOSIS — M810 Age-related osteoporosis without current pathological fracture: Secondary | ICD-10-CM | POA: Diagnosis not present

## 2017-08-15 DIAGNOSIS — E782 Mixed hyperlipidemia: Secondary | ICD-10-CM | POA: Diagnosis not present

## 2017-08-15 DIAGNOSIS — K219 Gastro-esophageal reflux disease without esophagitis: Secondary | ICD-10-CM | POA: Diagnosis not present

## 2017-08-15 DIAGNOSIS — R69 Illness, unspecified: Secondary | ICD-10-CM | POA: Diagnosis not present

## 2017-08-15 DIAGNOSIS — Z1339 Encounter for screening examination for other mental health and behavioral disorders: Secondary | ICD-10-CM | POA: Diagnosis not present

## 2017-08-15 DIAGNOSIS — Z1331 Encounter for screening for depression: Secondary | ICD-10-CM | POA: Diagnosis not present

## 2017-08-31 DIAGNOSIS — J329 Chronic sinusitis, unspecified: Secondary | ICD-10-CM | POA: Diagnosis not present

## 2017-08-31 DIAGNOSIS — Z6820 Body mass index (BMI) 20.0-20.9, adult: Secondary | ICD-10-CM | POA: Diagnosis not present

## 2017-08-31 DIAGNOSIS — J40 Bronchitis, not specified as acute or chronic: Secondary | ICD-10-CM | POA: Diagnosis not present

## 2017-09-29 ENCOUNTER — Encounter: Payer: Self-pay | Admitting: Pulmonary Disease

## 2017-09-29 DIAGNOSIS — Z0000 Encounter for general adult medical examination without abnormal findings: Secondary | ICD-10-CM | POA: Diagnosis not present

## 2017-09-29 DIAGNOSIS — Z6820 Body mass index (BMI) 20.0-20.9, adult: Secondary | ICD-10-CM | POA: Diagnosis not present

## 2017-09-29 DIAGNOSIS — R399 Unspecified symptoms and signs involving the genitourinary system: Secondary | ICD-10-CM | POA: Diagnosis not present

## 2017-10-13 DIAGNOSIS — R1084 Generalized abdominal pain: Secondary | ICD-10-CM | POA: Diagnosis not present

## 2017-11-09 DIAGNOSIS — R69 Illness, unspecified: Secondary | ICD-10-CM | POA: Diagnosis not present

## 2017-12-05 DIAGNOSIS — J432 Centrilobular emphysema: Secondary | ICD-10-CM | POA: Diagnosis not present

## 2017-12-05 DIAGNOSIS — K219 Gastro-esophageal reflux disease without esophagitis: Secondary | ICD-10-CM | POA: Diagnosis not present

## 2017-12-05 DIAGNOSIS — Z6820 Body mass index (BMI) 20.0-20.9, adult: Secondary | ICD-10-CM | POA: Diagnosis not present

## 2017-12-05 DIAGNOSIS — J441 Chronic obstructive pulmonary disease with (acute) exacerbation: Secondary | ICD-10-CM | POA: Diagnosis not present

## 2017-12-05 DIAGNOSIS — R69 Illness, unspecified: Secondary | ICD-10-CM | POA: Diagnosis not present

## 2018-01-01 ENCOUNTER — Encounter: Payer: Self-pay | Admitting: Pulmonary Disease

## 2018-01-01 ENCOUNTER — Ambulatory Visit: Payer: Medicare HMO | Admitting: Pulmonary Disease

## 2018-01-01 VITALS — BP 118/74 | HR 98 | Ht 63.5 in | Wt 106.0 lb

## 2018-01-01 DIAGNOSIS — J449 Chronic obstructive pulmonary disease, unspecified: Secondary | ICD-10-CM

## 2018-01-01 DIAGNOSIS — Z87891 Personal history of nicotine dependence: Secondary | ICD-10-CM

## 2018-01-01 MED ORDER — TIOTROPIUM BROMIDE-OLODATEROL 2.5-2.5 MCG/ACT IN AERS: 2.0000 | INHALATION_SPRAY | Freq: Every day | RESPIRATORY_TRACT | 0 refills | Status: DC

## 2018-01-01 NOTE — Progress Notes (Signed)
Synopsis: Referred in Novermber 2019 for COPD.  Subjective:   PATIENT ID: Amanda Cobb GENDER: female DOB: 1950-10-28, MRN: [id number]  Chief Complaint  Patient presents with  . Consult    States she has had a dry cough for years. States her coughing episodes are sometimes so severe she vomits. Currently uses Trelegy inhaler daily.     PMH of GERD and COPD. She was diagnosed with COPD by office spiro about 6 years ago. Quit smoking 3 years ago, smoked from age 37 to 9, about 1 ppd for 50 years.  Overall has been doing well but does notice cough on occasion.  She does have some shortness of breath.  Her cough is productive.  She denies fevers denies hemoptysis.  Her cough at times is strong enough to make her vomit.  She does have significant coughing spells worse in the mornings.  She is currently managed using a Trelegy inhaler.  She is not really sure that this made a whole lot of difference.  She used to be on Symbicort (she believes) in the past and she thought that this helped.   Past Medical History:  Diagnosis Date  . COPD (chronic obstructive pulmonary disease) ([place])   . GERD (gastroesophageal reflux disease)      Family History  Problem Relation Age of Onset  . Dementia Mother   . Asthma Father   . COPD Sister   . Depression Sister   . Depression Sister   . COPD Sister       Past Surgical History:  Procedure Laterality Date  . COLONOSCOPY    . ESOPHAGOGASTRODUODENOSCOPY    . GALLBLADDER SURGERY    . TUBAL LIGATION      Social History   Socioeconomic History  . Marital status: Married    Spouse name: Not on file  . Number of children: Not on file  . Years of education: Not on file  . Highest education level: Not on file  Occupational History  . Not on file  Social Needs  . Financial resource strain: Not on file  . Food insecurity:    Worry: Not on file    Inability: Not on file  . Transportation needs:    Medical: Not on file    Non-medical: Not on  file  Tobacco Use  . Smoking status: Former Research scientist (life sciences)  . Smokeless tobacco: Never Used  . Tobacco comment: quit in 2017  Substance and Sexual Activity  . Alcohol use: Yes    Comment: Occasional, social  . Drug use: Never  . Sexual activity: Not on file  Lifestyle  . Physical activity:    Days per week: Not on file    Minutes per session: Not on file  . Stress: Not on file  Relationships  . Social connections:    Talks on phone: Not on file    Gets together: Not on file    Attends religious service: Not on file    Active member of club or organization: Not on file    Attends meetings of clubs or organizations: Not on file    Relationship status: Not on file  . Intimate partner violence:    Fear of current or ex partner: Not on file    Emotionally abused: Not on file    Physically abused: Not on file    Forced sexual activity: Not on file  Other Topics Concern  . Not on file  Social History Narrative  . Not on file  Not on File   Outpatient Medications Prior to Visit  Medication Sig Dispense Refill  . alendronate (FOSAMAX) 70 MG tablet Take 70 mg by mouth once a week.  11  . ALPRAZolam (XANAX) 0.5 MG tablet TAKE 1 & 1/2 TABLET BY MOUTH AT BEDTIME  0  . calcium carbonate (OS-CAL - DOSED IN MG OF ELEMENTAL CALCIUM) 1250 (500 Ca) MG tablet Take 1 tablet by mouth.    . dextromethorphan-guaiFENesin (MUCINEX DM) 30-600 MG 12hr tablet Take 1 tablet by mouth 2 (two) times daily as needed for cough.    . pantoprazole (PROTONIX) 40 MG tablet Take by mouth.    . TRELEGY ELLIPTA 100-62.5-25 MCG/INH AEPB USE 1 PUFF DAILY  6   No facility-administered medications prior to visit.     Review of Systems  Constitutional: Negative for chills, fever, malaise/fatigue and weight loss.  HENT: Positive for congestion and sinus pain. Negative for hearing loss, sore throat and tinnitus.   Eyes: Negative for blurred vision and double vision.  Respiratory: Positive for cough, sputum production,  shortness of breath and wheezing. Negative for hemoptysis and stridor.   Cardiovascular: Negative for chest pain, palpitations, orthopnea, leg swelling and PND.  Gastrointestinal: Negative for abdominal pain, constipation, diarrhea, heartburn, nausea and vomiting.  Genitourinary: Negative for dysuria, hematuria and urgency.  Musculoskeletal: Negative for joint pain and myalgias.  Skin: Negative for itching and rash.  Neurological: Negative for dizziness, tingling, weakness and headaches.  Endo/Heme/Allergies: Negative for environmental allergies. Does not bruise/bleed easily.  Psychiatric/Behavioral: Negative for depression. The patient is not nervous/anxious and does not have insomnia.   All other systems reviewed and are negative.    Objective:  Physical Exam  Constitutional: She is oriented to person, place, and time. She appears well-developed. No distress.  HENT:  Head: Normocephalic and atraumatic.  Mouth/Throat: Oropharynx is clear and moist. No oropharyngeal exudate.  Eyes: Pupils are equal, round, and reactive to light. Conjunctivae and EOM are normal.  Neck: No JVD present. No tracheal deviation present.  Cardiovascular: Normal rate, regular rhythm, S1 normal, S2 normal and intact distal pulses.  Distant heart tones  Pulmonary/Chest: No accessory muscle usage or stridor. No tachypnea. She has decreased breath sounds (throughout all lung fields). She has no wheezes. She has no rhonchi. She has no rales.  Increased AP chest diameter  Abdominal: Soft. Bowel sounds are normal. She exhibits no distension. There is no tenderness.  Musculoskeletal: She exhibits no edema.  Neurological: She is alert and oriented to person, place, and time.  Skin: Skin is warm and dry. Capillary refill takes less than 2 seconds. No rash noted.  Psychiatric: She has a normal mood and affect. Her behavior is normal.  Vitals reviewed.    Vitals:   01/01/18 1023  BP: 118/74  Pulse: 98  SpO2: 97%    Weight: 106 lb (48.1 kg)  Height: 5' 3.5" (1.613 m)   97% on RA BMI Readings from Last 3 Encounters:  01/01/18 18.48 kg/m   Wt Readings from Last 3 Encounters:  01/01/18 106 lb (48.1 kg)     CBC No results found for: WBC, RBC, HGB, HCT, PLT, MCV, MCH, MCHC, RDW, LYMPHSABS, MONOABS, EOSABS, BASOSABS  Chest Imaging: No prior chest extending to review  Pulmonary Functions Testing Results: No flowsheet data found.  FeNO: None   Pathology: None   Echocardiogram: None   Heart Catheterization: None     Assessment & Plan:   Chronic obstructive pulmonary disease, unspecified COPD type ([place]) -  Plan: Pulmonary Function Test  Former tobacco use - Plan: Ambulatory Referral for Lung Cancer Scre  Discussion:  This is a 67 year old former smoker significant pack-year history, 50 pack years.  She had a diagnosis of COPD based on office spirometry approximately 5 to 6 years ago.  She has never had formal PFTs.  I suspect her ongoing shortness of breath and cough are related to COPD.  She currently does not like her dry powder inhaler.  Therefore we will start the patient on Stiolto to see if she tolerates the mist inhaler better.  Due to the patient's significant smoking history I do believe that she would benefit from enrollment in our lung cancer screening program.  I will place orders for follow-up into the lung cancer screening program.  Also obtain full PFTs upon patient's return She can follow-up in 6 weeks to see if she is doing okay with her new inhaler regimen.  Greater than 50% of this patient's 45-minute office visit was spent face-to-face discussing the above recommendations.  Patient was agreeable to this plan.   Current Outpatient Medications:  .  alendronate (FOSAMAX) 70 MG tablet, Take 70 mg by mouth once a week., Disp: , Rfl: 11 .  ALPRAZolam (XANAX) 0.5 MG tablet, TAKE 1 & 1/2 TABLET BY MOUTH AT BEDTIME, Disp: , Rfl: 0 .  calcium carbonate (OS-CAL - DOSED IN MG  OF ELEMENTAL CALCIUM) 1250 (500 Ca) MG tablet, Take 1 tablet by mouth., Disp: , Rfl:  .  dextromethorphan-guaiFENesin (MUCINEX DM) 30-600 MG 12hr tablet, Take 1 tablet by mouth 2 (two) times daily as needed for cough., Disp: , Rfl:  .  pantoprazole (PROTONIX) 40 MG tablet, Take by mouth., Disp: , Rfl:  .  TRELEGY ELLIPTA 100-62.5-25 MCG/INH AEPB, USE 1 PUFF DAILY, Disp: , Rfl: 6 .  Tiotropium Bromide-Olodaterol (STIOLTO RESPIMAT) 2.5-2.5 MCG/ACT AERS, Inhale 2 puffs into the lungs daily., Disp: 2 Inhaler, Rfl: 0   Garner Nash, DO [facility] 01/01/2018 5:48 PM

## 2018-01-01 NOTE — Patient Instructions (Addendum)
Thank you for visiting Dr. Valeta Harms at [facility].  Today we recommend the following: Orders Placed This Encounter  Procedures  . Pulmonary Function Test   Please hold your trelegy and start stioloto. If this is working better for you please call and we can send in a prescription.   Please schedule a follow up appointment with Eric Form, NP for enrollment into the [place] in next available.   Return in about 6 weeks (around 02/12/2018). or if symptoms worsen.

## 2018-01-01 NOTE — Progress Notes (Signed)
   Subjective:    Patient ID: Amanda Cobb, female    DOB: [birth year], 67 y.o.   MRN: [id number]  HPI    Review of Systems  Constitutional: Negative.   HENT: Positive for congestion, sinus pressure, sinus pain and trouble swallowing. Negative for dental problem, drooling, ear discharge, ear pain, facial swelling, hearing loss, mouth sores, nosebleeds, postnasal drip, rhinorrhea, sneezing, sore throat, tinnitus and voice change.   Eyes: Negative.   Respiratory: Positive for cough, chest tightness, shortness of breath and wheezing. Negative for apnea, choking and stridor.   Cardiovascular: Negative.   Gastrointestinal: Negative.   Endocrine: Negative.   Genitourinary: Negative.   Musculoskeletal: Negative.   Skin: Negative.   Allergic/Immunologic: Negative.   Neurological: Negative.   Hematological: Negative.   Psychiatric/Behavioral: Negative.        Objective:   Physical Exam        Assessment & Plan:

## 2018-01-09 ENCOUNTER — Telehealth: Payer: Self-pay | Admitting: Acute Care

## 2018-01-09 DIAGNOSIS — Z122 Encounter for screening for malignant neoplasm of respiratory organs: Secondary | ICD-10-CM

## 2018-01-09 DIAGNOSIS — Z87891 Personal history of nicotine dependence: Secondary | ICD-10-CM

## 2018-01-11 NOTE — Telephone Encounter (Signed)
Routing to Denise

## 2018-01-11 NOTE — Telephone Encounter (Signed)
Spoke with pt and scheduled SDMV 02/19/18 4:00 CT ordered Nothing further needed

## 2018-01-12 DIAGNOSIS — J432 Centrilobular emphysema: Secondary | ICD-10-CM | POA: Diagnosis not present

## 2018-01-12 DIAGNOSIS — R0781 Pleurodynia: Secondary | ICD-10-CM | POA: Diagnosis not present

## 2018-01-12 DIAGNOSIS — Z681 Body mass index (BMI) 19 or less, adult: Secondary | ICD-10-CM | POA: Diagnosis not present

## 2018-01-24 ENCOUNTER — Telehealth: Payer: Self-pay | Admitting: Pulmonary Disease

## 2018-01-24 NOTE — Telephone Encounter (Signed)
PCCM:  Patient can go back to the trelegy that she was taking in the past. Or we can go to a different inhaler regiment all together. We can discuss on her next visit. Or she can be seen by one of the Nps if she would like to be seen sooner.   Garner Nash, DO [facility] 01/24/2018 5:11 PM

## 2018-01-24 NOTE — Telephone Encounter (Signed)
Patients states her breathing was worse on the Stiolto so she has stopped that and has started back on the [employer]. She wanted to make BI aware of this.

## 2018-01-24 NOTE — Telephone Encounter (Signed)
Patient advised nothing further needed at this time.

## 2018-01-30 DIAGNOSIS — J209 Acute bronchitis, unspecified: Secondary | ICD-10-CM | POA: Diagnosis not present

## 2018-02-05 DIAGNOSIS — R05 Cough: Secondary | ICD-10-CM | POA: Diagnosis not present

## 2018-02-05 DIAGNOSIS — R0602 Shortness of breath: Secondary | ICD-10-CM | POA: Diagnosis not present

## 2018-02-05 DIAGNOSIS — J069 Acute upper respiratory infection, unspecified: Secondary | ICD-10-CM | POA: Diagnosis not present

## 2018-02-05 DIAGNOSIS — J40 Bronchitis, not specified as acute or chronic: Secondary | ICD-10-CM | POA: Diagnosis not present

## 2018-02-12 ENCOUNTER — Encounter: Payer: Self-pay | Admitting: Acute Care

## 2018-02-12 ENCOUNTER — Ambulatory Visit: Payer: Medicare HMO

## 2018-02-12 ENCOUNTER — Ambulatory Visit: Payer: Medicare HMO | Admitting: Acute Care

## 2018-02-12 VITALS — BP 120/70 | HR 90 | Ht 63.5 in | Wt 106.6 lb

## 2018-02-12 DIAGNOSIS — J441 Chronic obstructive pulmonary disease with (acute) exacerbation: Secondary | ICD-10-CM | POA: Diagnosis not present

## 2018-02-12 NOTE — Patient Instructions (Addendum)
It is nice to meet you today. We will send in a prescription for Trelegy today.  Use 1 puff every day without fail. This is your maintenance  medication. Rinse mouth after use. Continue to use your Albuterol as rescue for breakthrough shortness of breath or wheezing. This is your rescue medication. Note your daily symptoms > remember "red flags" for COPD:  Increase in cough, increase in sputum production, increase in shortness of breath or activity intolerance. If you notice these symptoms, please call to be seen.  We will re-schedule your pulmonary function test for before your next appointment with Dr. Valeta Harms. Do not use your inhaler for 4 hours before your PFT is scheduled. Follow up with Dr. Valeta Harms 3 months. Please contact office for sooner follow up if symptoms do not improve or worsen or seek emergency care

## 2018-02-12 NOTE — Progress Notes (Signed)
History of Present Illness Amanda Cobb is a 68 y.o. female former smoker ( Quit 2017) with COPD. She is followed by Dr. Valeta Harms.  PMH of GERD and COPD. She was diagnosed with COPD by office spiro about 6 years ago. Quit smoking 3 years ago, smoked from age 1 to 37, about 1 ppd for 50 years. She has never had formal PFT's.    02/12/2018 6 week Follow up: Pt. Presents today for follow up. She was last seen by Dr. Valeta Harms 01/01/2018. She was started on Stialto  at that time. Subsequently she was seen in the ED at [facility] on 02/05/2018 for bronchitis with viral syndrome and COPD flare. Plan of care was to use your inhalers as prescribed. Use albuterol 2 puffs every 4 hours as needed for shortness of breath, cough, wheezing, and tightness. Take Zithromax 500 mg once daily for the next 2 days. Take prednisone once daily in the morning with food for the next 5 days. She  declined her first dose of prednisone. She was given her your first dose of Zithromax.She was told to rest, stay hydrated, and drink plenty of fluids.  She presents today for follow up of starting on Stialto, as she was not happy with the powder delivery  inhaler.( Trelegy). She states she is feeling better after treatment with a z pack and prednisone taper. She has recently choked on a piece of steak , and a bystander performed Heimlich Maneuver. She has some rib bruising secondary to the rescue efforts, and she feels like she was unable to use take a deep breath and appropriately use her inhalers. .She states she is feeling better. She can take a deep breath without pain. Since being seen at [facility] ED she has been using her albuterol inhaler as rescue.  She did not realize that she could use albuterol as a rescue. She feels better knowing that may help with her intermittent shortness of breath.She states she does not have shortness of breath at present . 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 We will complete [employer] for Me paperwork. She denies fever, chest pain, orthopnea or hemoptysis. She states she no dyspnea today. She needs a prescription for Trelegy today.   Test Results: 02/06/2018 CXR>> [facility] ED No Active Cardiopulmonary disease Hyperinflation  No flowsheet data found.  No flowsheet data found.  BNP No results found for: BNP  ProBNP No results found for: PROBNP  PFT No results found for: FEV1PRE, FEV1POST, FVCPRE, FVCPOST, TLC, DLCOUNC, PREFEV1FVCRT, PSTFEV1FVCRT  No results found.   Past medical hx Past Medical History:  Diagnosis Date  . COPD (chronic obstructive pulmonary disease) ([place])   . GERD (gastroesophageal reflux disease)      Social History   Tobacco Use  . Smoking status: Former Research scientist (life sciences)  . Smokeless tobacco: Never Used  . Tobacco comment: quit in 2017  Substance Use Topics  . Alcohol use: Yes    Comment: Occasional, social  . Drug use: Never    [name] reports that she has quit smoking. She has never used smokeless tobacco. She reports current alcohol use. She reports that she does not use drugs.  Tobacco Cessation: Former smoker , quit 2017 I have spent 3 minutes counseling patient on continued smoking cessation this visit. Patient verbalizes understanding of their  choice to remain  Smoke free and  we reviewed the negative health consequences of smoking  including worsening of COPD, risk of lung cancer , stroke and heart disease.    Past surgical hx, Family hx, Social hx all reviewed.  Current Outpatient Medications on File Prior to Visit  Medication Sig  . albuterol (PROVENTIL HFA;VENTOLIN HFA) 108 (90 Base) MCG/ACT inhaler Inhale into the lungs every 6 (six) hours as needed for wheezing or shortness of breath.  Marland Kitchen alendronate (FOSAMAX) 70 MG tablet Take 70 mg by mouth once a week.  . ALPRAZolam (XANAX) 0.5 MG tablet TAKE 1 & 1/2 TABLET BY MOUTH AT  BEDTIME  . atorvastatin (LIPITOR) 20 MG tablet Take 20 mg by mouth every other day.  . calcium carbonate (OS-CAL - DOSED IN MG OF ELEMENTAL CALCIUM) 1250 (500 Ca) MG tablet Take 1 tablet by mouth.  . dextromethorphan-guaiFENesin (MUCINEX DM) 30-600 MG 12hr tablet Take 1 tablet by mouth 2 (two) times daily as needed for cough.  . pantoprazole (PROTONIX) 40 MG tablet Take by mouth.  . TRELEGY ELLIPTA 100-62.5-25 MCG/INH AEPB USE 1 PUFF DAILY   No current facility-administered medications on file prior to visit.      Allergies  Allergen Reactions  . Codeine Nausea And Vomiting    Review Of Systems:  Constitutional:   No  weight loss, night sweats,  Fevers, chills, + fatigue, or  lassitude.  HEENT:   No headaches,  Difficulty swallowing,  Tooth/dental problems, or  Sore throat,                No sneezing, itching, ear ache, nasal congestion, post nasal drip,   CV:  No chest pain,  Orthopnea, PND, swelling in lower extremities, anasarca, dizziness, palpitations, syncope.   GI  No heartburn, indigestion, abdominal pain, nausea, vomiting, diarrhea, change in bowel habits, loss of appetite, bloody stools.   Resp: + shortness of breath with exertion none  at rest.  No excess mucus, no productive cough,  No non-productive cough,  No coughing up of blood.  No change in color of mucus.  No wheezing.  No chest wall deformity  Skin: no rash or lesions, warm and dry.  GU: no dysuria, change in color of urine, no urgency or frequency.  No flank pain, no hematuria   MS:  No joint pain or swelling.  No decreased range of motion.  No back pain.  Psych:  No change in mood or affect. No depression or anxiety.  No memory loss.   Vital Signs BP 120/70 (BP Location: Right Arm, Cuff Size: Normal)   Pulse 90   Ht 5' 3.5" (1.613 m)   Wt 106 lb 9.6 oz (48.4 kg)   SpO2 95%   BMI 18.59 kg/m    Physical Exam:  General- No distress,  A&Ox3, pleasant ENT: No sinus tenderness, TM clear, pale nasal  mucosa, no oral exudate,no post nasal drip, no LAN Cardiac: S1, S2, regular rate and rhythm, no murmur Chest: Few faint expiratory  wheeze/ No rales/ dullness; no accessory muscle use, no nasal flaring, no sternal retractions Abd.: Soft Non-tender, ND, BS +. Body mass index is 18.59 kg/m. Ext: No clubbing cyanosis, edema Neuro:  normal strength, MAE x 4, A&O x 3, appropriate Skin: No rashes, warm and dry Psych: normal mood and behavior   Assessment/Plan  COPD Flare Required ED visit 02/05/2018 Better after z pack and pred taper Did not like Stialto Returned to [employer] [facility] [employer] We will send in a prescription for Trelegy today.  We will give you paperwork for Omar and me to help with the cost of Trelegy Use 1 puff every day without fail. This is your maintenance  medication. Rinse mouth after use. Continue to use your Albuterol as rescue for breakthrough shortness of breath or wheezing. This is your rescue medication. Note your daily symptoms > remember "red flags" for COPD:  Increase in cough, increase in sputum production, increase in shortness of breath or activity intolerance. If you notice these symptoms, please call to be seen.  We will re-schedule your pulmonary function test. Do not use your inhaler for 4 hours before your PFT is scheduled. Follow up with Dr. Valeta Harms 3 months. Please contact office for sooner follow up if symptoms do not improve or worsen or seek emergency care    Magdalen Spatz, NP 02/12/2018  2:15 PM

## 2018-02-15 NOTE — Progress Notes (Signed)
PCCM: Agree. Thanks for seeing her.  Garner Nash, DO [facility] 02/15/2018 7:31 PM

## 2018-02-19 ENCOUNTER — Ambulatory Visit (INDEPENDENT_AMBULATORY_CARE_PROVIDER_SITE_OTHER)
Admission: RE | Admit: 2018-02-19 | Discharge: 2018-02-19 | Disposition: A | Discharge status: Home / Self Care | Payer: Medicare HMO | Source: Ambulatory Visit | Attending: Acute Care | Admitting: Acute Care

## 2018-02-19 ENCOUNTER — Ambulatory Visit (INDEPENDENT_AMBULATORY_CARE_PROVIDER_SITE_OTHER): Payer: Medicare HMO | Admitting: Acute Care

## 2018-02-19 ENCOUNTER — Encounter: Payer: Self-pay | Admitting: Acute Care

## 2018-02-19 VITALS — BP 104/64 | HR 82 | Ht 63.5 in | Wt 106.2 lb

## 2018-02-19 DIAGNOSIS — Z87891 Personal history of nicotine dependence: Secondary | ICD-10-CM | POA: Diagnosis not present

## 2018-02-19 DIAGNOSIS — Z122 Encounter for screening for malignant neoplasm of respiratory organs: Secondary | ICD-10-CM

## 2018-02-19 NOTE — Progress Notes (Signed)
Shared Decision Making Visit Lung Cancer Screening Program [phone])   Eligibility:  Age 68 y.o.  Pack Years Smoking History Calculation 51 pack year smoking history (# packs/per year x # years smoked)  Recent History of coughing up blood  no  Unexplained weight loss? no ( >Than 15 pounds within the last 6 months )  Prior History Lung / other cancer no (Diagnosis within the last 5 years already requiring surveillance chest CT Scans).  Smoking Status Former Smoker  Former Smokers: Years since quit: 3 years  Quit Date: 2016  Visit Components:  Discussion included one or more decision making aids. yes  Discussion included risk/benefits of screening. yes  Discussion included potential follow up diagnostic testing for abnormal scans. yes  Discussion included meaning and risk of over diagnosis. yes  Discussion included meaning and risk of False Positives. yes  Discussion included meaning of total radiation exposure. yes  Counseling Included:  Importance of adherence to annual lung cancer LDCT screening. yes  Impact of comorbidities on ability to participate in the program. yes  Ability and willingness to under diagnostic treatment. yes  Smoking Cessation Counseling:  Current Smokers:   Discussed importance of smoking cessation. yes  Information about tobacco cessation classes and interventions provided to patient. yes  Patient provided with "ticket" for LDCT Scan. yes  Symptomatic Patient. no  Counseling  Diagnosis Code: Tobacco Use Z72.0  Asymptomatic Patient yes  Counseling (Intermediate counseling: > three minutes counseling) O2703  Former Smokers:   Discussed the importance of maintaining cigarette abstinence. yes  Diagnosis Code: Personal History of Nicotine Dependence. J00.938  Information about tobacco cessation classes and interventions provided to patient. Yes  Patient provided with "ticket" for LDCT Scan. yes  Written Order for Lung Cancer  Screening with LDCT placed in Epic. Yes (CT Chest Lung Cancer Screening Low Dose W/O CM) [id number] Z12.2-Screening of respiratory organs Z87.891-Personal history of nicotine dependence   I spent 25 minutes of face to face time with Ms. Freund discussing the risks and benefits of lung cancer screening. We viewed a power point together that explained in detail the above noted topics. We took the time to pause the power point at intervals to allow for questions to be asked and answered to ensure understanding. We discussed that she had taken the single most powerful action possible to decrease her risk of developing lung cancer when she quit smoking. I counseled her to remain smoke free, and to contact me if she ever had the desire to smoke again so that I can provide resources and tools to help support the effort to remain smoke free. We discussed the time and location of the scan, and that either  Doroteo Glassman RN or I will call with the results within  24-48 hours of receiving them. She has my card and contact information in the event she needs to speak with me, in addition to a copy of the power point we reviewed as a resource. She verbalized understanding of all of the above and had no further questions upon leaving the office.     I explained to the patient that there has been a high incidence of coronary artery disease noted on these exams. I explained that this is a non-gated exam therefore degree or severity cannot be determined. This patient is on statin therapy. I have asked the patient to follow-up with their PCP regarding any incidental finding of coronary artery disease and management with diet or medication as they feel is clinically  indicated. The patient verbalized understanding of the above and had no further questions.     Magdalen Spatz, NP 02/19/2018 4:54 PM

## 2018-02-20 ENCOUNTER — Telehealth: Payer: Self-pay

## 2018-02-20 DIAGNOSIS — R69 Illness, unspecified: Secondary | ICD-10-CM | POA: Diagnosis not present

## 2018-02-20 NOTE — Telephone Encounter (Signed)
I will call her tomorrow. If I get a change to call her today from the hospital, I will. Thanks so much.

## 2018-02-20 NOTE — Telephone Encounter (Signed)
SG, [facility] called her lung cancer screening report because it was a lung RAD 3S.

## 2018-02-21 ENCOUNTER — Telehealth: Payer: Self-pay | Admitting: Acute Care

## 2018-02-23 ENCOUNTER — Other Ambulatory Visit: Payer: Self-pay | Admitting: Acute Care

## 2018-02-23 DIAGNOSIS — Z87891 Personal history of nicotine dependence: Secondary | ICD-10-CM

## 2018-02-23 DIAGNOSIS — Z122 Encounter for screening for malignant neoplasm of respiratory organs: Secondary | ICD-10-CM

## 2018-02-23 NOTE — Telephone Encounter (Signed)
See result note 02/22/18.  Eric Form ,NP notified pt of CT results. Nothing further needed at this time.

## 2018-03-08 ENCOUNTER — Telehealth: Payer: Self-pay | Admitting: Acute Care

## 2018-03-08 DIAGNOSIS — H524 Presbyopia: Secondary | ICD-10-CM | POA: Diagnosis not present

## 2018-03-08 NOTE — Telephone Encounter (Signed)
Message routed to EW for requested contact.   EW - please advise

## 2018-03-08 NOTE — Telephone Encounter (Signed)
I have called and spoken with Dr. Jabier Mutton regarding follow up of the renal lesion noted on the low dose screening scan. Nothing further is needed. He will call back if he needs any further assistance.

## 2018-03-08 NOTE — Telephone Encounter (Signed)
Called and spoke with Lanette Hampshire [facility], [phone] ext [phone].  She stated that they received a message to schedule Patient abd. MRI, with and without contrast, because of low dose lung cancer screening, showed abnormal findings.  MRI was denied, because of questionable [place], that [facility] has no knowledge of.  Dr Lin Landsman called [employer], 03/07/18, and MRI was denied.  She stated that there is also a 6 month lung screening CT recommended.   Will route to [place] to follow up

## 2018-03-08 NOTE — Telephone Encounter (Signed)
Discussed with Judson Roch, she is willing to call Dr. Lin Landsman. See other telephone note for phone number. If anything further needed please let me know.

## 2018-03-09 NOTE — Telephone Encounter (Signed)
See other telephone note dated 03/08/18.  Sarah as spoke with Dr Lin Landsman.  Nothing further needed at this time.

## 2018-03-23 ENCOUNTER — Telehealth: Payer: Self-pay | Admitting: Acute Care

## 2018-03-23 NOTE — Telephone Encounter (Signed)
Called and spoke with patient regarding insurance not covering MRI Pt would like SG to advise if MRI is truly needed If so, would SG be willing to appeal the denial for MRI by writing letter or calling them Advised patient SG is out of the office this afternoon, will route message to her to advise  SG please advise. Thank you.

## 2018-03-26 NOTE — Telephone Encounter (Signed)
Message previously routed to Sela Hilding, NP to follow up

## 2018-03-26 NOTE — Telephone Encounter (Signed)
Amanda Cobb, this MRI was denied because insurance stated that she had an Ultra sound instead. Can you ask her if she had the ultra sound? If she did not have the [place] and insurance thinks she did, this may be the reason they are denying the scan. Thanks

## 2018-03-27 NOTE — Telephone Encounter (Signed)
Spoke with pt, verified that she has not had an ultrasound to follow up on renal lesion.    SG please advise on how to proceed.  Thanks.

## 2018-03-27 NOTE — Telephone Encounter (Signed)
Pt is calling back [phone] 

## 2018-03-27 NOTE — Telephone Encounter (Signed)
       Called and spoke with patient today regarding SG concerns below Pt advised she has not had Ultasound completed She advised she will call her insurance company tomorrow to try and get to the bottom of this denial

## 2018-03-28 NOTE — Telephone Encounter (Signed)
Thanks so much. 

## 2018-04-12 DIAGNOSIS — Z1231 Encounter for screening mammogram for malignant neoplasm of breast: Secondary | ICD-10-CM | POA: Diagnosis not present

## 2018-04-30 DIAGNOSIS — H18603 Keratoconus, unspecified, bilateral: Secondary | ICD-10-CM | POA: Diagnosis not present

## 2018-04-30 DIAGNOSIS — H2513 Age-related nuclear cataract, bilateral: Secondary | ICD-10-CM | POA: Diagnosis not present

## 2018-08-01 DIAGNOSIS — K219 Gastro-esophageal reflux disease without esophagitis: Secondary | ICD-10-CM | POA: Diagnosis not present

## 2018-08-01 DIAGNOSIS — Z79899 Other long term (current) drug therapy: Secondary | ICD-10-CM | POA: Diagnosis not present

## 2018-08-01 DIAGNOSIS — H6120 Impacted cerumen, unspecified ear: Secondary | ICD-10-CM | POA: Diagnosis not present

## 2018-08-01 DIAGNOSIS — R69 Illness, unspecified: Secondary | ICD-10-CM | POA: Diagnosis not present

## 2018-08-01 DIAGNOSIS — J432 Centrilobular emphysema: Secondary | ICD-10-CM | POA: Diagnosis not present

## 2018-08-01 DIAGNOSIS — E782 Mixed hyperlipidemia: Secondary | ICD-10-CM | POA: Diagnosis not present

## 2018-08-01 DIAGNOSIS — K5090 Crohn's disease, unspecified, without complications: Secondary | ICD-10-CM | POA: Diagnosis not present

## 2018-08-01 DIAGNOSIS — I70219 Atherosclerosis of native arteries of extremities with intermittent claudication, unspecified extremity: Secondary | ICD-10-CM | POA: Diagnosis not present

## 2018-08-08 ENCOUNTER — Ambulatory Visit: Payer: Medicare HMO | Admitting: Pulmonary Disease

## 2018-08-14 DIAGNOSIS — Z6820 Body mass index (BMI) 20.0-20.9, adult: Secondary | ICD-10-CM | POA: Diagnosis not present

## 2018-08-14 DIAGNOSIS — H6991 Unspecified Eustachian tube disorder, right ear: Secondary | ICD-10-CM | POA: Diagnosis not present

## 2018-08-14 DIAGNOSIS — R42 Dizziness and giddiness: Secondary | ICD-10-CM | POA: Diagnosis not present

## 2018-08-31 ENCOUNTER — Telehealth: Payer: Self-pay | Admitting: Acute Care

## 2018-08-31 NOTE — Telephone Encounter (Signed)
This is regarding lung cancer screening CT. Will route to lung nodule pool for follow-up.

## 2018-09-04 ENCOUNTER — Telehealth: Payer: Self-pay | Admitting: Acute Care

## 2018-09-04 ENCOUNTER — Ambulatory Visit (INDEPENDENT_AMBULATORY_CARE_PROVIDER_SITE_OTHER): Payer: Medicare HMO | Admitting: Primary Care

## 2018-09-04 ENCOUNTER — Other Ambulatory Visit: Payer: Self-pay

## 2018-09-04 ENCOUNTER — Encounter: Payer: Self-pay | Admitting: Primary Care

## 2018-09-04 DIAGNOSIS — J441 Chronic obstructive pulmonary disease with (acute) exacerbation: Secondary | ICD-10-CM | POA: Diagnosis not present

## 2018-09-04 MED ORDER — DOXYCYCLINE HYCLATE 100 MG PO TABS: 100.0000 mg | ORAL_TABLET | Freq: Two times a day (BID) | ORAL | 0 refills | Status: DC

## 2018-09-04 MED ORDER — PREDNISONE 10 MG PO TABS: ORAL_TABLET | ORAL | 0 refills | Status: DC

## 2018-09-04 NOTE — Progress Notes (Signed)
Virtual Visit via Telephone Note  I connected with Amanda Cobb on 09/04/18 at  4:30 PM EDT by telephone and verified that I am speaking with the correct person using two identifiers.  Location: Patient: Home Provider: Office   I discussed the limitations, risks, security and privacy concerns of performing an evaluation and management service by telephone and the availability of in person appointments. I also discussed with the patient that there may be a patient responsible charge related to this service. The patient expressed understanding and agreed to proceed.  History of Present Illness: 68 year old female, former smoker quit in 2017 (50 pack year hx). PMH significant for COPD, GERD. Patient of Dr. Valeta Harms, last seen by pulmonary NP on 02/12/18 and follows with lung cancer screening program. Started on [place] in November 2019. Diagnosed with COPD from office spirometry 5-6 years ago. No formal PFTs.  09/04/2018 Patient contacted today for televisit, complains of cough with shortness of breath x 2 weeks. Currently using Trelegy daily, she feels it's been helpful until recently. She has been using her Albuterol rescue inhaler 4 times a day. Cough is productive with clear mucus. Heat worsens her symptoms. Denies fever or sick contact.   Observations/Objective:  - No shortness of breath, wheezing or cough noted during phone conversation  Assessment and Plan:  COPD exacerbation - Increased cough and shortness of breath x 2 weeks - Continue Trelegy 1 puff daily; prn albuterol hfa - RX doxycycline 1 tab bid x 7 days; prednisone taper (40mg  x 2 days; 30mg  x 2 days; 20mg  x 2 days; 10mg  x 2 days) - Mucinex twice daily  - Needs PFTs re-scheduled with COVID testing prior   Tobacco abuse - Former smoker +50 pack year hx - Following with lung cancer screening program - LDCT 02/20/18 showed lung RADS3  - Needs repeat LDCT in 6 months (July 2020)  Follow Up Instructions:   -FU in 3 months with  Dr. Valeta Harms or sooner if needed/worsening symptoms  I discussed the assessment and treatment plan with the patient. The patient was provided an opportunity to ask questions and all were answered. The patient agreed with the plan and demonstrated an understanding of the instructions.   The patient was advised to call back or seek an in-person evaluation if the symptoms worsen or if the condition fails to improve as anticipated.  I provided 18 minutes of non-face-to-face time during this encounter.   Martyn Ehrich, NP

## 2018-09-04 NOTE — Telephone Encounter (Signed)
Called and spoke with pt asking if I could get her scheduled for a televisit and pt verbalized that was okay. Pt has been scheduled for televisit with Beth today at 4:30. Nothing further needed.

## 2018-09-04 NOTE — Telephone Encounter (Signed)
Needs video or televisit for evaluation   Please contact office for sooner follow up if symptoms do not improve or worsen or seek emergency care

## 2018-09-04 NOTE — Telephone Encounter (Signed)
Pt is calling back [phone]

## 2018-09-04 NOTE — Telephone Encounter (Signed)
Left message for patient to call back to get scheduled.  

## 2018-09-04 NOTE — Patient Instructions (Addendum)
Treating you for suspected COPD exacerbation/bronchitis  RX: Doxycycline 1 tab twice daily x 7 days Prednisone taper (40mg  x 2 days; 30mg  x 2 days; 20mg  x 2 days; 10mg  x 2 days)  Needs PFTs re-scheduled   Follow up with Dr. Valeta Harms in 3-4 months or sooner if needed

## 2018-09-04 NOTE — Telephone Encounter (Signed)
Primary Pulmonologist: BI Last office visit and with whom: SG 02/12/18 What do we see them for (pulmonary problems): COPD Last OV assessment/plan:  It is nice to meet you today. We will send in a prescription for Trelegy today.  Use 1 puff every day without fail. This is your maintenance  medication. Rinse mouth after use. Continue to use your Albuterol as rescue for breakthrough shortness of breath or wheezing. This is your rescue medication. Note your daily symptoms >remember "red flags" for COPD: Increase in cough, increase in sputum production, increase in shortness of breath or activity intolerance. If you notice these symptoms, please call to be seen.  We will re-schedule your pulmonary function test for before your next appointment with Dr. Valeta Harms. Do not use your inhaler for 4 hours before your PFT is scheduled. Follow up with Dr. Valeta Harms 3 months. Please contact office for sooner follow up if symptoms do not improve or worsen or seek emergency care   Was appointment offered to patient (explain)?  Patient declined.    Reason for call: Patient stated that she has had a cough for the past 2 weeks. It is a productive cough with clear mucus. She has also noticed some increased SOB. She stated that she has not been around anyone with COVID that she knows of. Denies having any fever, body aches. Stated that she has been taking Sudafed without any relief as well as using her Trelegy and albuterol (using this about 2-3 times daily).   Pharmacy is [facility] in [place].   TP, please advise. Thanks!

## 2018-09-04 NOTE — Telephone Encounter (Signed)
Called patient. She is interested in getting scheduled for a televisit. Patient wants me to call her back in about 5 minutes.

## 2018-09-05 NOTE — Telephone Encounter (Signed)
Erline Levine over in [place] called this patient and has her scheduled for 10/10/2018 @ 10:00am

## 2018-09-05 NOTE — Telephone Encounter (Signed)
Amanda Cobb, can you look at this to see if pt will need to also have a shared decision visit or if it is just the CT that pt is only needing to be scheduled for since Langley Gauss is currently on vacation.

## 2018-09-11 DIAGNOSIS — R69 Illness, unspecified: Secondary | ICD-10-CM | POA: Diagnosis not present

## 2018-09-11 NOTE — Progress Notes (Signed)
PCCM: Agree. Thanks for seeing.  Garner Nash, DO [facility] 09/11/2018 11:20 AM

## 2018-09-28 DIAGNOSIS — J441 Chronic obstructive pulmonary disease with (acute) exacerbation: Secondary | ICD-10-CM | POA: Diagnosis not present

## 2018-09-28 DIAGNOSIS — Z6820 Body mass index (BMI) 20.0-20.9, adult: Secondary | ICD-10-CM | POA: Diagnosis not present

## 2018-10-03 DIAGNOSIS — J432 Centrilobular emphysema: Secondary | ICD-10-CM | POA: Diagnosis not present

## 2018-10-03 DIAGNOSIS — Z1331 Encounter for screening for depression: Secondary | ICD-10-CM | POA: Diagnosis not present

## 2018-10-03 DIAGNOSIS — R69 Illness, unspecified: Secondary | ICD-10-CM | POA: Diagnosis not present

## 2018-10-03 DIAGNOSIS — Z6820 Body mass index (BMI) 20.0-20.9, adult: Secondary | ICD-10-CM | POA: Diagnosis not present

## 2018-10-03 DIAGNOSIS — I70219 Atherosclerosis of native arteries of extremities with intermittent claudication, unspecified extremity: Secondary | ICD-10-CM | POA: Diagnosis not present

## 2018-10-03 DIAGNOSIS — J449 Chronic obstructive pulmonary disease, unspecified: Secondary | ICD-10-CM | POA: Diagnosis not present

## 2018-10-03 DIAGNOSIS — M810 Age-related osteoporosis without current pathological fracture: Secondary | ICD-10-CM | POA: Diagnosis not present

## 2018-10-03 DIAGNOSIS — Z0000 Encounter for general adult medical examination without abnormal findings: Secondary | ICD-10-CM | POA: Diagnosis not present

## 2018-10-05 ENCOUNTER — Other Ambulatory Visit: Payer: Self-pay

## 2018-10-05 DIAGNOSIS — I739 Peripheral vascular disease, unspecified: Secondary | ICD-10-CM

## 2018-10-08 ENCOUNTER — Encounter: Payer: Self-pay | Admitting: *Deleted

## 2018-10-08 DIAGNOSIS — K219 Gastro-esophageal reflux disease without esophagitis: Secondary | ICD-10-CM | POA: Insufficient documentation

## 2018-10-08 DIAGNOSIS — J449 Chronic obstructive pulmonary disease, unspecified: Secondary | ICD-10-CM | POA: Insufficient documentation

## 2018-10-09 ENCOUNTER — Ambulatory Visit (HOSPITAL_COMMUNITY)
Admission: RE | Admit: 2018-10-09 | Discharge: 2018-10-09 | Disposition: A | Discharge status: Home / Self Care | Payer: Medicare HMO | Source: Ambulatory Visit | Attending: Family | Admitting: Family

## 2018-10-09 ENCOUNTER — Ambulatory Visit: Payer: Medicare HMO | Admitting: Vascular Surgery

## 2018-10-09 ENCOUNTER — Other Ambulatory Visit: Payer: Self-pay

## 2018-10-09 ENCOUNTER — Encounter: Payer: Self-pay | Admitting: Vascular Surgery

## 2018-10-09 DIAGNOSIS — I739 Peripheral vascular disease, unspecified: Secondary | ICD-10-CM | POA: Insufficient documentation

## 2018-10-09 DIAGNOSIS — M79606 Pain in leg, unspecified: Secondary | ICD-10-CM | POA: Insufficient documentation

## 2018-10-09 NOTE — Progress Notes (Signed)
Patient name: Amanda Cobb MRN: [id number] DOB: Apr 03, 1950 Sex: female  REASON FOR CONSULT: Evaluate left leg pain with concern for claudication  HPI: Amanda Cobb is a 68 y.o. female, with history of COPD that presents for evaluation of left leg pain and concern for claudication.  Patient states she had studies done about 3 years ago by her PCP who since left with some concern for possible blockage in her left leg.  She does describe episodes where she gets some discomfort in her left calf particularly when walking on a treadmill for at least 10 minutes.  She also gets this when she walks very very long distances shopping.  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  This does not limit her generally unless on treadmill or walking long distances.  She used to smoke but has quit smoking over the last 4 years.  She states her leg is feeling much better over the last several weeks since coming off steroids for her COPD.  Stated she thought about canceling her appointment today.  Past Medical History:  Diagnosis Date  . COPD (chronic obstructive pulmonary disease) ([place])   . GERD (gastroesophageal reflux disease)     Past Surgical History:  Procedure Laterality Date  . COLONOSCOPY    . ESOPHAGOGASTRODUODENOSCOPY    . GALLBLADDER SURGERY    . TUBAL LIGATION      Family History  Problem Relation Age of Onset  . Dementia Mother   . Asthma Father   . COPD Sister   . Depression Sister   . Depression Sister   . COPD Sister     SOCIAL HISTORY: Social History   Socioeconomic History  . Marital status: Married    Spouse name: Not on file  . Number of children: Not on file  . Years of education: Not on file  . Highest education level: Not on file  Occupational History  . Not on file  Social Needs  . Financial resource strain: Not on file  . Food insecurity    Worry: Not on file    Inability: Not on file  . Transportation needs    Medical: Not  on file    Non-medical: Not on file  Tobacco Use  . Smoking status: Former Smoker    Packs/day: 1.00    Years: 51.00    Pack years: 51.00    Types: Cigarettes    Start date: 1965  . Smokeless tobacco: Never Used  . Tobacco comment: quit in 2017  Substance and Sexual Activity  . Alcohol use: Yes    Comment: Occasional, social  . Drug use: Never  . Sexual activity: Not on file  Lifestyle  . Physical activity    Days per week: Not on file    Minutes per session: Not on file  . Stress: Not on file  Relationships  . Social Herbalist on phone: Not on file    Gets together: Not on file    Attends religious service: Not on file    Active member of club or organization: Not on file    Attends meetings of clubs or organizations: Not on file    Relationship status: Not on file  . Intimate partner violence    Fear of current or ex partner: Not on file    Emotionally abused: Not on file    Physically abused: Not on file    Forced sexual activity: Not on file  Other Topics Concern  . Not on file  Social History Narrative  . Not on file    Allergies  Allergen Reactions  . Codeine Nausea And Vomiting    Current Outpatient Medications  Medication Sig Dispense Refill  . albuterol (PROVENTIL HFA;VENTOLIN HFA) 108 (90 Base) MCG/ACT inhaler Inhale into the lungs every 6 (six) hours as needed for wheezing or shortness of breath.    Marland Kitchen alendronate (FOSAMAX) 70 MG tablet Take 70 mg by mouth once a week.  11  . ALPRAZolam (XANAX) 0.5 MG tablet TAKE 1 & 1/2 TABLET BY MOUTH AT BEDTIME  0  . atorvastatin (LIPITOR) 20 MG tablet Take 20 mg by mouth every other day.  2  . calcium carbonate (OS-CAL - DOSED IN MG OF ELEMENTAL CALCIUM) 1250 (500 Ca) MG tablet Take 1 tablet by mouth.    . dextromethorphan-guaiFENesin (MUCINEX DM) 30-600 MG 12hr tablet Take 1 tablet by mouth 2 (two) times daily as needed for cough.    Marland Kitchen ipratropium-albuterol (DUONEB) 0.5-2.5 (3) MG/3ML SOLN INHALE 3  MILLILITER EVERY 6 HOURS AS NEEDED FOR COPD EXACERBATION    . meclizine (ANTIVERT) 25 MG tablet TAKE 1 TABLET DAILY AS NEEDED FOR VERTIGO    . pantoprazole (PROTONIX) 40 MG tablet Take by mouth.    . predniSONE (DELTASONE) 10 MG tablet Take 4 tabs po daily x 2 days; then 3 tabs for 2 days; then 2 tabs for 2 days; then 1 tab for 2 days 20 tablet 0  . TRELEGY ELLIPTA 100-62.5-25 MCG/INH AEPB USE 1 PUFF DAILY  6  . doxycycline (VIBRA-TABS) 100 MG tablet Take 1 tablet (100 mg total) by mouth 2 (two) times daily. (Patient not taking: Reported on 10/09/2018) 14 tablet 0   No current facility-administered medications for this visit.     REVIEW OF SYSTEMS:  [X]  denotes positive finding, [ ]  denotes negative finding Cardiac  Comments:  Chest pain or chest pressure:    Shortness of breath upon exertion:    Short of breath when lying flat:    Irregular heart rhythm:        Vascular    Pain in calf, thigh, or hip brought on by ambulation:    Pain in feet at night that wakes you up from your sleep:     Blood clot in your veins:    Leg swelling:         Pulmonary    Oxygen at home:    Productive cough:     Wheezing:         Neurologic    Sudden weakness in arms or legs:     Sudden numbness in arms or legs:     Sudden onset of difficulty speaking or slurred speech:    Temporary loss of vision in one eye:     Problems with dizziness:         Gastrointestinal    Blood in stool:     Vomited blood:         Genitourinary    Burning when urinating:     Blood in urine:        Psychiatric    Major depression:         Hematologic    Bleeding problems:    Problems with blood clotting too easily:        Skin    Rashes or ulcers:        Constitutional    Fever or chills:  PHYSICAL EXAM: Vitals:   10/09/18 0952  BP: 136/89  Pulse: 78  Resp: 14  Temp: 98.6 F (37 C)  TempSrc: Temporal  SpO2: 98%  Weight: 107 lb (48.5 kg)  Height: 5\' 3"  (1.6 m)    GENERAL: The patient is a  well-nourished female, in no acute distress. The vital signs are documented above. CARDIAC: There is a regular rate and rhythm.  VASCULAR:  2+ palpable femoral pulse palpable bilaterally 2+ DP/PT palpable bilaterally PULMONARY: There is good air exchange bilaterally without wheezing or rales. ABDOMEN: Soft and non-tender with normal pitched bowel sounds.  MUSCULOSKELETAL: There are no major deformities or cyanosis. NEUROLOGIC: No focal weakness or paresthesias are detected. SKIN: There are no ulcers or rashes noted. PSYCHIATRIC: The patient has a normal affect.  DATA:   ABIs are 1.2 on the right triphasic; 1.12 on the left triphasic -these are normal with no evidence of vascular disease  Assessment/Plan:  68 year old female who presents for evaluation of left lower extremity leg pain with concern for claudication.  I discussed with her in detail that she has a normal pulse exam with palpable dorsalis pedis and posterior tibial pulses as well as normal noninvasive studies with normal ABIs and triphasic waveform at the ankle.  Certainly given her history of heavy tobacco abuse she could have some mild underlying vascular disease that is causing her to claudicate, even though her baseline resting studies are normal.  However she only has symptoms when walking on her treadmill for over 10 minutes otherwise there is really no limitation to this.  In addition her leg has felt much better over the last several weeks.  I discussed that I am not certain this is claudication given her exam and noninvasive findings but certainly if her symptoms progress we would manage this conservatively at this time given it is really not lifestyle limiting.  Typically recommend exercise, smoking cessation and aspirin daily.  I do not think we can make her any better with a vascular intervention and potentially would expose her to a number of risks.  She seems very happy with this with the reassurance.  Offered her schedule  follow-up if she preferred she will contact our office with questions or concerns.   Marty Heck, MD [facility] Office: [phone] Pager: [phone]

## 2018-10-10 ENCOUNTER — Ambulatory Visit (INDEPENDENT_AMBULATORY_CARE_PROVIDER_SITE_OTHER)
Admission: RE | Admit: 2018-10-10 | Discharge: 2018-10-10 | Disposition: A | Discharge status: Home / Self Care | Payer: Medicare HMO | Source: Ambulatory Visit | Attending: Acute Care | Admitting: Acute Care

## 2018-10-10 ENCOUNTER — Telehealth: Payer: Self-pay | Admitting: Acute Care

## 2018-10-10 DIAGNOSIS — Z122 Encounter for screening for malignant neoplasm of respiratory organs: Secondary | ICD-10-CM

## 2018-10-10 DIAGNOSIS — R918 Other nonspecific abnormal finding of lung field: Secondary | ICD-10-CM

## 2018-10-10 DIAGNOSIS — J479 Bronchiectasis, uncomplicated: Secondary | ICD-10-CM | POA: Diagnosis not present

## 2018-10-10 DIAGNOSIS — Z87891 Personal history of nicotine dependence: Secondary | ICD-10-CM

## 2018-10-10 DIAGNOSIS — J439 Emphysema, unspecified: Secondary | ICD-10-CM | POA: Diagnosis not present

## 2018-10-10 NOTE — Telephone Encounter (Signed)
Noted. Will close encounter. Thanks.

## 2018-10-10 NOTE — Telephone Encounter (Signed)
Received call report from [place] with [facility] on patient's CT Chest LCS Nodule F/U WO Contrast done on 10/10/2018. SG, please review the result/impression copied below:   1. New left upper lobe nodules measure up to 8.8 mm. Lung-RADS 4B, suspicious. Additional imaging evaluation or consultation with Pulmonology or Thoracic Surgery recommended. These results will be called to the ordering clinician or representative by the Radiologist Assistant, and communication documented in the PACS or zVision Dashboard.  Please advise, thank you.

## 2018-10-10 NOTE — Telephone Encounter (Signed)
I will call the patient with the results tomorrow.

## 2018-10-12 ENCOUNTER — Other Ambulatory Visit: Payer: Self-pay | Admitting: *Deleted

## 2018-10-12 DIAGNOSIS — J449 Chronic obstructive pulmonary disease, unspecified: Secondary | ICD-10-CM

## 2018-10-12 DIAGNOSIS — R918 Other nonspecific abnormal finding of lung field: Secondary | ICD-10-CM

## 2018-10-30 DIAGNOSIS — R69 Illness, unspecified: Secondary | ICD-10-CM | POA: Diagnosis not present

## 2018-11-01 ENCOUNTER — Ambulatory Visit (INDEPENDENT_AMBULATORY_CARE_PROVIDER_SITE_OTHER): Payer: Medicare HMO | Admitting: Primary Care

## 2018-11-01 ENCOUNTER — Encounter: Payer: Self-pay | Admitting: Primary Care

## 2018-11-01 DIAGNOSIS — J441 Chronic obstructive pulmonary disease with (acute) exacerbation: Secondary | ICD-10-CM

## 2018-11-01 MED ORDER — BUDESONIDE-FORMOTEROL FUMARATE 160-4.5 MCG/ACT IN AERO: 2.0000 | INHALATION_SPRAY | Freq: Two times a day (BID) | RESPIRATORY_TRACT | 0 refills | Status: DC

## 2018-11-01 MED ORDER — SPIRIVA RESPIMAT 2.5 MCG/ACT IN AERS: 2.0000 | INHALATION_SPRAY | Freq: Every day | RESPIRATORY_TRACT | 0 refills | Status: DC

## 2018-11-01 MED ORDER — ROFLUMILAST 500 MCG PO TABS: 500.0000 ug | ORAL_TABLET | Freq: Every day | ORAL | 3 refills | Status: DC

## 2018-11-01 NOTE — Patient Instructions (Addendum)
It was a pleasure speaking with you today Trial off Trelegy and restart Symbicort and Spiriva I am looking into what inhaler options will be the cheapest for you  Recommendations: Stop Trelegy Start Symbicort 2 puffs twice daily Start Spiriva 2 puffs once daily   Rx: Increase Daliresp to 500 mg daily (prescription sent to pharmacy)  Orders: Sputum culture  PFTs   Referral: [facility] for medication and disease management of COPD (cost of inhalers)

## 2018-11-01 NOTE — Addendum Note (Signed)
Addended by: Vivia Ewing on: 11/01/2018 10:17 AM   Modules accepted: Orders

## 2018-11-01 NOTE — Progress Notes (Signed)
Virtual Visit via Telephone Note  I connected with Amanda Cobb on 11/01/18 at  9:30 AM EDT by telephone and verified that I am speaking with the correct person using two identifiers.  Location: Patient: Home Provider: Office   I discussed the limitations, risks, security and privacy concerns of performing an evaluation and management service by telephone and the availability of in person appointments. I also discussed with the patient that there may be a patient responsible charge related to this service. The patient expressed understanding and agreed to proceed.   History of Present Illness: 68 year old female, former smoker quit in 2017 (50 pack year hx). PMH significant for COPD, GERD. Patient of Dr. Valeta Harms, last seen by pulmonary NP on 02/12/18 and follows with lung cancer screening program. Started on [place] in November 2019. Diagnosed with COPD from office spirometry 5-6 years ago. No formal PFTs.  Previous [facility] pulmonary encounters: 09/04/2018 Patient contacted today for televisit, complains of cough with shortness of breath x 2 weeks. Currently using Trelegy daily, she feels it's been helpful until recently. She has been using her Albuterol rescue inhaler 4 times a day. Cough is productive with clear mucus. Heat worsens her symptoms. Denies fever or sick contact.   11/01/2018 Patient contacted today for acute televisit. LDCT on 10/10/18 showed new left upper lobe nodules measuring up to 8.32mm Lung RADS 4B, imaging results discussed with Dr. Valeta Harms. The area is felt to be too small to biopsy and barley large enough for PET imaging, plan is to repeat CT in 3 months in December and if >1cm will proceed with PET scan.   She hasn't been feeling well since July. Reports no significant improvement from abx, steriods, daliresp or nebulizer since added. Reports significant shortness of breath and fatigue in the morning and 5 hours after using her inhaler. Associated cough which is productive with clear  white-green sputum. She is taking trelegy as prescribed (stiolto didn't do as well as Trelegy). Duoneb help for a short period of time. Using albuterol hfa at work which does work. She has been on Daliresp 250mg  for 28 days- prescribed by PCP. No PFTs on file, needs to schedule.    Observations/Objective:  - NO significant sob, wheezing noted over phone call - Mild coughing  Assessment and Plan: 68 year old female with significant smoking history.  History of COPD, no PFTs on file.  She was originally seen in our office for consult back in November 2019.  She was given prescription for Stiolto but patient felt that it did not do as well as Trelegy.  She has had significant shortness of breath since July with no improvement after antibiotic and steroid x2 course.  She is still working and reports significant shortness of breath and fatigue after 5 hours of taking her inhaler.  Plan is to change once a day trelegy dosing to trial of Symbicort 160 two puffs twice daily and Spiriva Respimat 2.5 mcg once daily.  We will also increase her Daliresp to 500 mg daily.  We will check a sputum culture as well. Most recent low-dose CT scan in September showed new left upper lobe nodules measuring 8.8 mm-lung RADS 4B.  Plan is to repeat CT scan in 3 months in December and if >1cm proceed with PET scan.  Needs PFTs scheduled.  COPD - Stop Trelegy - Trial Symbicort 160 and Spiriva respimat 2.5 (sample given) - Increase Daliresp to 500mg  daily (prescription sent) - Continue Duonebs/Albuterol hfa q4-6 hours prn sob/wheezing - Referral  to [facility] for medication management (cost of inhalers) - Needs to schedule PFTs - Check sputum culture   Follow Up Instructions:   4 weeks OV with PFTs  I discussed the assessment and treatment plan with the patient. The patient was provided an opportunity to ask questions and all were answered. The patient agreed with the plan and demonstrated an understanding of the  instructions.   The patient was advised to call back or seek an in-person evaluation if the symptoms worsen or if the condition fails to improve as anticipated.  I provided 22 minutes of non-face-to-face time during this encounter.   Martyn Ehrich, NP

## 2018-11-04 NOTE — Progress Notes (Signed)
PCCM: Agree. Thanks for seeing her Garner Nash, DO [place] 11/04/2018 11:14 AM

## 2018-11-06 ENCOUNTER — Other Ambulatory Visit: Payer: Medicare HMO

## 2018-11-06 DIAGNOSIS — J441 Chronic obstructive pulmonary disease with (acute) exacerbation: Secondary | ICD-10-CM

## 2018-11-06 DIAGNOSIS — R69 Illness, unspecified: Secondary | ICD-10-CM | POA: Diagnosis not present

## 2018-11-06 DIAGNOSIS — Z23 Encounter for immunization: Secondary | ICD-10-CM | POA: Diagnosis not present

## 2018-11-06 DIAGNOSIS — J432 Centrilobular emphysema: Secondary | ICD-10-CM | POA: Diagnosis not present

## 2018-11-06 DIAGNOSIS — J449 Chronic obstructive pulmonary disease, unspecified: Secondary | ICD-10-CM | POA: Diagnosis not present

## 2018-11-06 DIAGNOSIS — Z6820 Body mass index (BMI) 20.0-20.9, adult: Secondary | ICD-10-CM | POA: Diagnosis not present

## 2018-11-06 DIAGNOSIS — R0600 Dyspnea, unspecified: Secondary | ICD-10-CM | POA: Diagnosis not present

## 2018-11-06 DIAGNOSIS — H6091 Unspecified otitis externa, right ear: Secondary | ICD-10-CM | POA: Diagnosis not present

## 2018-11-08 ENCOUNTER — Other Ambulatory Visit: Payer: Self-pay

## 2018-11-08 ENCOUNTER — Encounter: Payer: Self-pay | Admitting: Cardiology

## 2018-11-08 ENCOUNTER — Ambulatory Visit (INDEPENDENT_AMBULATORY_CARE_PROVIDER_SITE_OTHER): Payer: Medicare HMO | Admitting: Cardiology

## 2018-11-08 VITALS — BP 140/80 | HR 83 | Ht 63.0 in | Wt 109.0 lb

## 2018-11-08 DIAGNOSIS — I2510 Atherosclerotic heart disease of native coronary artery without angina pectoris: Secondary | ICD-10-CM | POA: Diagnosis not present

## 2018-11-08 DIAGNOSIS — R0609 Other forms of dyspnea: Secondary | ICD-10-CM | POA: Insufficient documentation

## 2018-11-08 DIAGNOSIS — R0789 Other chest pain: Secondary | ICD-10-CM | POA: Diagnosis not present

## 2018-11-08 DIAGNOSIS — J410 Simple chronic bronchitis: Secondary | ICD-10-CM

## 2018-11-08 DIAGNOSIS — Z87891 Personal history of nicotine dependence: Secondary | ICD-10-CM

## 2018-11-08 DIAGNOSIS — R0600 Dyspnea, unspecified: Secondary | ICD-10-CM | POA: Diagnosis not present

## 2018-11-08 NOTE — Progress Notes (Signed)
Cardiology Consultation:    Date:  11/08/2018   ID:  Amanda Cobb, DOB 12-16-1950, MRN [id number]  PCP:  Elenore Paddy, NP  Cardiologist:  Jenne Campus, MD   Referring MD: Elenore Paddy, NP   Chief Complaint  Patient presents with  . Shortness of Breath    History of Present Illness:    Amanda Cobb is a 68 y.o. female who is being seen today for the evaluation of shortness of breath at the request of Elenore Paddy, NP.  She is a lady with a long history of smoking she started smoking when she was 16 and quit smoking 4 years ago she does have advanced COPD.  However, she noted dramatic increase in her shortness of breath within the last month.  She is worried that she may have coronary artery disease or heart problem on top of her COPD.  Her reasoning is quite logical.  She also described to have some heavy sensation when she does do some exercises in the chest.  Does not have swelling of lower extremities at evening time.  She said walking around will give her shortness of breath. Risk factors for coronary disease include history of smoking Dyslipidemia Family history  Past Medical History:  Diagnosis Date  . COPD (chronic obstructive pulmonary disease) ([place])   . GERD (gastroesophageal reflux disease)     Past Surgical History:  Procedure Laterality Date  . COLONOSCOPY    . ESOPHAGOGASTRODUODENOSCOPY    . GALLBLADDER SURGERY    . TUBAL LIGATION      Current Medications: Current Meds  Medication Sig  . albuterol (PROVENTIL HFA;VENTOLIN HFA) 108 (90 Base) MCG/ACT inhaler Inhale into the lungs every 6 (six) hours as needed for wheezing or shortness of breath.  . ALPRAZolam (XANAX) 0.5 MG tablet TAKE 1 & 1/2 TABLET BY MOUTH AT BEDTIME  . atorvastatin (LIPITOR) 20 MG tablet Take 20 mg by mouth every other day.  . budesonide-formoterol (SYMBICORT) 160-4.5 MCG/ACT inhaler Inhale 2 puffs into the lungs every 12 (twelve) hours.  . calcium carbonate (OS-CAL - DOSED IN MG OF  ELEMENTAL CALCIUM) 1250 (500 Ca) MG tablet Take 1 tablet by mouth.  . dextromethorphan-guaiFENesin (MUCINEX DM) 30-600 MG 12hr tablet Take 1 tablet by mouth 2 (two) times daily as needed for cough.  . escitalopram (LEXAPRO) 5 MG tablet Take 1 tablet by mouth daily.  Marland Kitchen ipratropium-albuterol (DUONEB) 0.5-2.5 (3) MG/3ML SOLN INHALE 3 MILLILITER EVERY 6 HOURS AS NEEDED FOR COPD EXACERBATION  . meclizine (ANTIVERT) 25 MG tablet TAKE 1 TABLET DAILY AS NEEDED FOR VERTIGO  . ondansetron (ZOFRAN-ODT) 8 MG disintegrating tablet Take 8 mg by mouth every 8 (eight) hours as needed.  . pantoprazole (PROTONIX) 40 MG tablet Take by mouth.  . roflumilast (DALIRESP) 500 MCG TABS tablet Take 1 tablet (500 mcg total) by mouth daily.  . Tiotropium Bromide Monohydrate (SPIRIVA RESPIMAT) 2.5 MCG/ACT AERS Inhale 2 puffs into the lungs daily.     Allergies:   Codeine   Social History   Socioeconomic History  . Marital status: Married    Spouse name: Not on file  . Number of children: Not on file  . Years of education: Not on file  . Highest education level: Not on file  Occupational History  . Not on file  Social Needs  . Financial resource strain: Not on file  . Food insecurity    Worry: Not on file    Inability: Not on file  . Transportation needs  Medical: Not on file    Non-medical: Not on file  Tobacco Use  . Smoking status: Former Smoker    Packs/day: 1.00    Years: 51.00    Pack years: 51.00    Types: Cigarettes    Start date: 1965  . Smokeless tobacco: Never Used  . Tobacco comment: quit in 2017  Substance and Sexual Activity  . Alcohol use: Yes    Comment: Occasional, social  . Drug use: Never  . Sexual activity: Not on file  Lifestyle  . Physical activity    Days per week: Not on file    Minutes per session: Not on file  . Stress: Not on file  Relationships  . Social Herbalist on phone: Not on file    Gets together: Not on file    Attends religious service: Not on  file    Active member of club or organization: Not on file    Attends meetings of clubs or organizations: Not on file    Relationship status: Not on file  Other Topics Concern  . Not on file  Social History Narrative  . Not on file     Family History: The patient's family history includes Asthma in her father; COPD in her sister and sister; Dementia in her mother; Depression in her sister and sister. ROS:   Please see the history of present illness.    All 14 point review of systems negative except as described per history of present illness.  EKGs/Labs/Other Studies Reviewed:    The following studies were reviewed today:   EKG:  EKG is  ordered today.  The ekg ordered today demonstrates normal sinus rhythm right atrial nonaspirin, nonspecific ST segment changes  Recent Labs: No results found for requested labs within last 8760 hours.  Recent Lipid Panel No results found for: CHOL, TRIG, HDL, CHOLHDL, VLDL, LDLCALC, LDLDIRECT  Physical Exam:    VS:  BP 140/80   Pulse 83   Ht 5\' 3"  (1.6 m)   Wt 109 lb (49.4 kg)   SpO2 96%   BMI 19.31 kg/m     Wt Readings from Last 3 Encounters:  11/08/18 109 lb (49.4 kg)  10/09/18 107 lb (48.5 kg)  02/19/18 106 lb 3.2 oz (48.2 kg)     GEN:  Well nourished, well developed in no acute distress HEENT: Normal NECK: No JVD; No carotid bruits LYMPHATICS: No lymphadenopathy CARDIAC: RRR, no murmurs, no rubs, no gallops RESPIRATORY:  Clear to auscultation without rales, wheezing or rhonchi  ABDOMEN: Soft, non-tender, non-distended MUSCULOSKELETAL:  No edema; No deformity  SKIN: Warm and dry NEUROLOGIC:  Alert and oriented x 3 PSYCHIATRIC:  Normal affect   ASSESSMENT:    1. Simple chronic bronchitis ([place])   2. Dyspnea on exertion   3. Atypical chest pain   4. Coronary artery disease involving native coronary artery of native heart without angina pectoris   5. History of smoking    PLAN:    In order of problems listed above:   1. COPD/chronic bronchitis that being followed by pulmonary. 2. Dyspnea on exertion probably multifactorial I will schedule him to have echocardiogram to assess left ventricular ejection fraction as well as stress test to rule out possibility of ischemia.  I asked her to start taking one baby aspirin every single day until we do the test. 3. Coronary artery disease as proven by CT showing calcification of the coronary artery stress test will be done to assess  significant of those problems. 4. History of smoking likely she abstain from smoking she has no intention to come back to smoking. 5. Dyslipidemia she takes Lipitor 20 which I will continue.  Will contact primary care physician to get her fasting lipid profile.  See her back in my office in about 6 weeks or sooner she has a problem   Medication Adjustments/Labs and Tests Ordered: Current medicines are reviewed at length with the patient today.  Concerns regarding medicines are outlined above.  No orders of the defined types were placed in this encounter.  No orders of the defined types were placed in this encounter.   Signed, Park Liter, MD, [facility]. 11/08/2018 2:41 PM    [facility]

## 2018-11-08 NOTE — Patient Instructions (Addendum)
Medication Instructions:  .isntcur  If you need a refill on your cardiac medications before your next appointment, please call your pharmacy.   Lab work: None.  If you have labs (blood work) drawn today and your tests are completely normal, you will receive your results only by:  [place] (if you have MyChart) OR  A paper copy in the mail If you have any lab test that is abnormal or we need to change your treatment, we will call you to review the results.  Testing/Procedures: Your physician has requested that you have an echocardiogram. Echocardiography is a painless test that uses sound waves to create images of your heart. It provides your doctor with information about the size and shape of your heart and how well your hearts chambers and valves are working. This procedure takes approximately one hour. There are no restrictions for this procedure.  Your physician has requested that you have a lexiscan myoview. For further information please visit [email or web address]. Please follow instruction sheet, as given.    Follow-Up: At [facility], you and your health needs are our priority.  As part of our continuing mission to provide you with exceptional heart care, we have created designated Provider Care Teams.  These Care Teams include your primary Cardiologist (physician) and Advanced Practice Providers (APPs -  Physician Assistants and Nurse Practitioners) who all work together to provide you with the care you need, when you need it. You will need a follow up appointment in 6 weeks.  Please call our office 2 months in advance to schedule this appointment.  You may see No primary care provider on file. or another member of our [employer] Provider Team in [place]: Shirlee More, MD  Jyl Heinz, MD  Any Other Special Instructions Will Be Listed Below (If Applicable).   Echocardiogram An echocardiogram is a procedure that uses painless sound waves (ultrasound) to  produce an image of the heart. Images from an echocardiogram can provide important information about:  Signs of coronary artery disease (CAD).  Aneurysm detection. An aneurysm is a weak or damaged part of an artery wall that bulges out from the normal force of blood pumping through the body.  Heart size and shape. Changes in the size or shape of the heart can be associated with certain conditions, including heart failure, aneurysm, and CAD.  Heart muscle function.  Heart valve function.  Signs of a past heart attack.  Fluid buildup around the heart.  Thickening of the heart muscle.  A tumor or infectious growth around the heart valves. Tell a health care provider about:  Any allergies you have.  All medicines you are taking, including vitamins, herbs, eye drops, creams, and over-the-counter medicines.  Any blood disorders you have.  Any surgeries you have had.  Any medical conditions you have.  Whether you are pregnant or may be pregnant. What are the risks? Generally, this is a safe procedure. However, problems may occur, including:  Allergic reaction to dye (contrast) that may be used during the procedure. What happens before the procedure? No specific preparation is needed. You may eat and drink normally. What happens during the procedure?   An IV tube may be inserted into one of your veins.  You may receive contrast through this tube. A contrast is an injection that improves the quality of the pictures from your heart.  A gel will be applied to your chest.  A wand-like tool (transducer) will be moved over your chest. The gel will help  to transmit the sound waves from the transducer.  The sound waves will harmlessly bounce off of your heart to allow the heart images to be captured in real-time motion. The images will be recorded on a computer. The procedure may vary among health care providers and hospitals. What happens after the procedure?  You may return to  your normal, everyday life, including diet, activities, and medicines, unless your health care provider tells you not to do that. Summary  An echocardiogram is a procedure that uses painless sound waves (ultrasound) to produce an image of the heart.  Images from an echocardiogram can provide important information about the size and shape of your heart, heart muscle function, heart valve function, and fluid buildup around your heart.  You do not need to do anything to prepare before this procedure. You may eat and drink normally.  After the echocardiogram is completed, you may return to your normal, everyday life, unless your health care provider tells you not to do that. This information is not intended to replace advice given to you by your health care provider. Make sure you discuss any questions you have with your health care provider. Document Released: 01/22/2000 Document Revised: 05/17/2018 Document Reviewed: 02/27/2016 Elsevier Patient Education  2020 [place].   Cardiac Nuclear Scan A cardiac nuclear scan is a test that measures blood flow to the heart when a person is resting and when he or she is exercising. The test looks for problems such as:  Not enough blood reaching a portion of the heart.  The heart muscle not working normally. You may need this test if:  You have heart disease.  You have had abnormal lab results.  You have had heart surgery or a balloon procedure to open up blocked arteries (angioplasty).  You have chest pain.  You have shortness of breath. In this test, a radioactive dye (tracer) is injected into your bloodstream. After the tracer has traveled to your heart, an imaging device is used to measure how much of the tracer is absorbed by or distributed to various areas of your heart. This procedure is usually done at a hospital and takes 2-4 hours. Tell a health care provider about:  Any allergies you have.  All medicines you are taking,  including vitamins, herbs, eye drops, creams, and over-the-counter medicines.  Any problems you or family members have had with anesthetic medicines.  Any blood disorders you have.  Any surgeries you have had.  Any medical conditions you have.  Whether you are pregnant or may be pregnant. What are the risks? Generally, this is a safe procedure. However, problems may occur, including:  Serious chest pain and heart attack. This is only a risk if the stress portion of the test is done.  Rapid heartbeat.  Sensation of warmth in your chest. This usually passes quickly.  Allergic reaction to the tracer. What happens before the procedure?  Ask your health care provider about changing or stopping your regular medicines. This is especially important if you are taking diabetes medicines or blood thinners.  Follow instructions from your health care provider about eating or drinking restrictions.  Remove your jewelry on the day of the procedure. What happens during the procedure?  An IV will be inserted into one of your veins.  Your health care provider will inject a small amount of radioactive tracer through the IV.  You will wait for 20-40 minutes while the tracer travels through your bloodstream.  Your heart activity will be  monitored with an electrocardiogram (ECG).  You will lie down on an exam table.  Images of your heart will be taken for about 15-20 minutes.  You may also have a stress test. For this test, one of the following may be done: ? You will exercise on a treadmill or stationary bike. While you exercise, your heart's activity will be monitored with an ECG, and your blood pressure will be checked. ? You will be given medicines that will increase blood flow to parts of your heart. This is done if you are unable to exercise.  When blood flow to your heart has peaked, a tracer will again be injected through the IV.  After 20-40 minutes, you will get back on the exam  table and have more images taken of your heart.  Depending on the type of tracer used, scans may need to be repeated 3-4 hours later.  Your IV line will be removed when the procedure is over. The procedure may vary among health care providers and hospitals. What happens after the procedure?  Unless your health care provider tells you otherwise, you may return to your normal schedule, including diet, activities, and medicines.  Unless your health care provider tells you otherwise, you may increase your fluid intake. This will help to flush the contrast dye from your body. Drink enough fluid to keep your urine pale yellow.  Ask your health care provider, or the department that is doing the test: ? When will my results be ready? ? How will I get my results? Summary  A cardiac nuclear scan measures the blood flow to the heart when a person is resting and when he or she is exercising.  Tell your health care provider if you are pregnant.  Before the procedure, ask your health care provider about changing or stopping your regular medicines. This is especially important if you are taking diabetes medicines or blood thinners.  After the procedure, unless your health care provider tells you otherwise, increase your fluid intake. This will help flush the contrast dye from your body.  After the procedure, unless your health care provider tells you otherwise, you may return to your normal schedule, including diet, activities, and medicines. This information is not intended to replace advice given to you by your health care provider. Make sure you discuss any questions you have with your health care provider. Document Released: 02/19/2004 Document Revised: 07/10/2017 Document Reviewed: 07/10/2017 Elsevier Patient Education  2020 [employer].

## 2018-11-08 NOTE — Addendum Note (Signed)
Addended by: Aleatha Borer on: 11/08/2018 02:48 PM   Modules accepted: Orders

## 2018-11-12 NOTE — Progress Notes (Signed)
Sputum culture preliminary result appear normal

## 2018-11-16 ENCOUNTER — Encounter (HOSPITAL_COMMUNITY): Payer: Self-pay | Admitting: Emergency Medicine

## 2018-11-16 ENCOUNTER — Emergency Department (HOSPITAL_COMMUNITY)
Admission: EM | Admit: 2018-11-16 | Discharge: 2018-11-16 | Disposition: A | Discharge status: Home / Self Care | Payer: Medicare HMO | Attending: Emergency Medicine | Admitting: Emergency Medicine

## 2018-11-16 ENCOUNTER — Other Ambulatory Visit: Payer: Self-pay

## 2018-11-16 ENCOUNTER — Emergency Department (HOSPITAL_COMMUNITY): Payer: Medicare HMO

## 2018-11-16 DIAGNOSIS — Z79899 Other long term (current) drug therapy: Secondary | ICD-10-CM | POA: Diagnosis not present

## 2018-11-16 DIAGNOSIS — I2510 Atherosclerotic heart disease of native coronary artery without angina pectoris: Secondary | ICD-10-CM | POA: Diagnosis not present

## 2018-11-16 DIAGNOSIS — Z87891 Personal history of nicotine dependence: Secondary | ICD-10-CM | POA: Diagnosis not present

## 2018-11-16 DIAGNOSIS — J449 Chronic obstructive pulmonary disease, unspecified: Secondary | ICD-10-CM | POA: Insufficient documentation

## 2018-11-16 DIAGNOSIS — R0602 Shortness of breath: Secondary | ICD-10-CM | POA: Diagnosis not present

## 2018-11-16 DIAGNOSIS — J441 Chronic obstructive pulmonary disease with (acute) exacerbation: Secondary | ICD-10-CM

## 2018-11-16 LAB — CBC
HCT: 39.5 % (ref 36.0–46.0)
Hemoglobin: 12.8 g/dL (ref 12.0–15.0)
MCH: 28.8 pg (ref 26.0–34.0)
MCHC: 32.4 g/dL (ref 30.0–36.0)
MCV: 88.8 fL (ref 80.0–100.0)
Platelets: 411 10*3/uL — ABNORMAL HIGH (ref 150–400)
RBC: 4.45 MIL/uL (ref 3.87–5.11)
RDW: 12.9 % (ref 11.5–15.5)
WBC: 9.1 10*3/uL (ref 4.0–10.5)
nRBC: 0 % (ref 0.0–0.2)

## 2018-11-16 LAB — HEPATIC FUNCTION PANEL
ALT: 19 U/L (ref 0–44)
AST: 23 U/L (ref 15–41)
Albumin: 4 g/dL (ref 3.5–5.0)
Alkaline Phosphatase: 76 U/L (ref 38–126)
Bilirubin, Direct: <0.1 mg/dL (ref 0.0–0.2)
Total Bilirubin: 0.4 mg/dL (ref 0.3–1.2)
Total Protein: 7.3 g/dL (ref 6.5–8.1)

## 2018-11-16 LAB — D-DIMER, QUANTITATIVE: D-Dimer, Quant: 0.29 ug/mL-FEU (ref 0.00–0.50)

## 2018-11-16 LAB — BASIC METABOLIC PANEL
Anion gap: 12 (ref 5–15)
BUN: 5 mg/dL — ABNORMAL LOW (ref 8–23)
CO2: 27 mmol/L (ref 22–32)
Calcium: 9.5 mg/dL (ref 8.9–10.3)
Chloride: 100 mmol/L (ref 98–111)
Creatinine, Ser: 0.63 mg/dL (ref 0.44–1.00)
GFR calc Af Amer: >60 mL/min (ref >60)
GFR calc non Af Amer: >60 mL/min (ref >60)
Glucose, Bld: 119 mg/dL — ABNORMAL HIGH (ref 70–99)
Potassium: 3.3 mmol/L — ABNORMAL LOW (ref 3.5–5.1)
Sodium: 139 mmol/L (ref 135–145)

## 2018-11-16 LAB — TROPONIN I (HIGH SENSITIVITY)
Troponin I (High Sensitivity): 6 ng/L (ref <18)
Troponin I (High Sensitivity): 5 ng/L (ref <18)

## 2018-11-16 MED ORDER — PREDNISONE 20 MG PO TABS
60.0000 mg | ORAL_TABLET | Freq: Once | ORAL | Status: AC
  Administered 2018-11-16: 60 mg via ORAL
  Filled 2018-11-16: qty 3

## 2018-11-16 MED ORDER — MAGNESIUM SULFATE 2 GM/50ML IV SOLN
2.0000 g | Freq: Once | INTRAVENOUS | Status: AC
  Administered 2018-11-16: 2 g via INTRAVENOUS
  Filled 2018-11-16: qty 50

## 2018-11-16 MED ORDER — PREDNISONE 10 MG PO TABS: 20.0000 mg | ORAL_TABLET | Freq: Every day | ORAL | 0 refills | Status: DC

## 2018-11-16 MED ORDER — ALBUTEROL SULFATE HFA 108 (90 BASE) MCG/ACT IN AERS
4.0000 | INHALATION_SPRAY | Freq: Once | RESPIRATORY_TRACT | Status: DC
  Filled 2018-11-16: qty 6.7

## 2018-11-16 NOTE — Discharge Instructions (Addendum)
Continue using your nebulizers and inhalers as prescribed by your doctor.  Follow-up with your doctor next week for recheck.  Return sooner if problems

## 2018-11-16 NOTE — ED Triage Notes (Addendum)
Pt reports a hx of COPD and for the last couple of months has felt her breathing off. Pt reports she woke up this morning with increased SOB and took 2 puff's on her inhaler with relief however she still feels bad.

## 2018-11-16 NOTE — ED Notes (Signed)
Patient verbalizes understanding of discharge instructions. Opportunity for questioning and answers were provided. Armband removed by staff, pt discharged from ED.  

## 2018-11-16 NOTE — ED Provider Notes (Signed)
[facility]ERGENCY DEPARTMENT Provider Note   CSN: [id number] Arrival date & time: 11/16/18  0820     History   Chief Complaint Chief Complaint  Patient presents with  . COPD    HPI Amanda Cobb is a 68 y.o. female.     Patient complains of shortness of breath.  Patient has a history of COPD.  She uses 2 inhalers and an albuterol neb  The history is provided by the patient.  COPD This is a recurrent problem. The current episode started more than 2 days ago. The problem occurs every several days. The problem has not changed since onset.Associated symptoms include shortness of breath. Pertinent negatives include no chest pain, no abdominal pain and no headaches. Nothing aggravates the symptoms. Nothing relieves the symptoms. She has tried nothing for the symptoms. The treatment provided no relief.    Past Medical History:  Diagnosis Date  . COPD (chronic obstructive pulmonary disease) ([place])   . GERD (gastroesophageal reflux disease)     Patient Active Problem List   Diagnosis Date Noted  . Dyspnea on exertion 11/08/2018  . Atypical chest pain 11/08/2018  . Coronary artery disease calcification of coronary artery noted on the CT in 2020 11/08/2018  . History of smoking 11/08/2018  . Leg pain 10/09/2018  . GERD (gastroesophageal reflux disease)   . COPD (chronic obstructive pulmonary disease) ([place])   . Crohn's disease of small intestine with other complication ([place]) [id number]    Past Surgical History:  Procedure Laterality Date  . COLONOSCOPY    . ESOPHAGOGASTRODUODENOSCOPY    . GALLBLADDER SURGERY    . TUBAL LIGATION       OB History   No obstetric history on file.      Home Medications    Prior to Admission medications   Medication Sig Start Date End Date Taking? Authorizing Provider  albuterol (PROVENTIL HFA;VENTOLIN HFA) 108 (90 Base) MCG/ACT inhaler Inhale into the lungs every 6 (six) hours as needed for wheezing or shortness of  breath.    [provider]  ALPRAZolam Duanne Moron) 0.5 MG tablet TAKE 1 & 1/2 TABLET BY MOUTH AT BEDTIME 12/03/17   [provider]  atorvastatin (LIPITOR) 20 MG tablet Take 20 mg by mouth every other day. 11/08/17   [provider]  budesonide-formoterol (SYMBICORT) 160-4.5 MCG/ACT inhaler Inhale 2 puffs into the lungs every 12 (twelve) hours. 11/01/18   Martyn Ehrich, NP  calcium carbonate (OS-CAL - DOSED IN MG OF ELEMENTAL CALCIUM) 1250 (500 Ca) MG tablet Take 1 tablet by mouth.    [provider]  dextromethorphan-guaiFENesin (MUCINEX DM) 30-600 MG 12hr tablet Take 1 tablet by mouth 2 (two) times daily as needed for cough.    [provider]  escitalopram (LEXAPRO) 5 MG tablet Take 1 tablet by mouth daily. 11/06/18   [provider]  ipratropium-albuterol (DUONEB) 0.5-2.5 (3) MG/3ML SOLN INHALE 3 MILLILITER EVERY 6 HOURS AS NEEDED FOR COPD EXACERBATION 10/03/18   [provider]  meclizine (ANTIVERT) 25 MG tablet TAKE 1 TABLET DAILY AS NEEDED FOR VERTIGO 08/14/18   [provider]  ondansetron (ZOFRAN-ODT) 8 MG disintegrating tablet Take 8 mg by mouth every 8 (eight) hours as needed. 10/19/18   [provider]  pantoprazole (PROTONIX) 40 MG tablet Take by mouth.    [provider]  predniSONE (DELTASONE) 10 MG tablet Take 2 tablets (20 mg total) by mouth daily. 11/16/18   Milton Ferguson, MD  roflumilast (DALIRESP) 500 MCG  TABS tablet Take 1 tablet (500 mcg total) by mouth daily. 11/01/18   Martyn Ehrich, NP  Tiotropium Bromide Monohydrate (SPIRIVA RESPIMAT) 2.5 MCG/ACT AERS Inhale 2 puffs into the lungs daily. 11/01/18 12/01/18  Martyn Ehrich, NP    Family History Family History  Problem Relation Age of Onset  . Dementia Mother   . Asthma Father   . COPD Sister   . Depression Sister   . Depression Sister   . COPD Sister     Social History Social History   Tobacco Use  . Smoking status: Former  Smoker    Packs/day: 1.00    Years: 51.00    Pack years: 51.00    Types: Cigarettes    Start date: 1965  . Smokeless tobacco: Never Used  . Tobacco comment: quit in 2017  Substance Use Topics  . Alcohol use: Yes    Comment: Occasional, social  . Drug use: Never     Allergies   Codeine   Review of Systems Review of Systems  Constitutional: Negative for appetite change and fatigue.  HENT: Negative for congestion, ear discharge and sinus pressure.   Eyes: Negative for discharge.  Respiratory: Positive for shortness of breath. Negative for cough.   Cardiovascular: Negative for chest pain.  Gastrointestinal: Negative for abdominal pain and diarrhea.  Genitourinary: Negative for frequency and hematuria.  Musculoskeletal: Negative for back pain.  Skin: Negative for rash.  Neurological: Negative for seizures and headaches.  Psychiatric/Behavioral: Negative for hallucinations.     Physical Exam Updated Vital Signs BP 139/85   Pulse 91   Temp 98 F (36.7 C) (Oral)   Resp (!) 27   Ht 5\' 2"  (1.575 m)   Wt 46.3 kg   LMP  (Exact Date)   SpO2 93%   BMI 18.66 kg/m   Physical Exam Vitals signs and nursing note reviewed.  Constitutional:      Appearance: She is well-developed.  HENT:     Head: Normocephalic.     Nose: Nose normal.  Eyes:     General: No scleral icterus.    Conjunctiva/sclera: Conjunctivae normal.  Neck:     Musculoskeletal: Neck supple.     Thyroid: No thyromegaly.  Cardiovascular:     Rate and Rhythm: Normal rate and regular rhythm.     Heart sounds: No murmur. No friction rub. No gallop.   Pulmonary:     Breath sounds: No stridor. Wheezing present. No rales.  Chest:     Chest wall: No tenderness.  Abdominal:     General: There is no distension.     Tenderness: There is no abdominal tenderness. There is no rebound.  Musculoskeletal: Normal range of motion.  Lymphadenopathy:     Cervical: No cervical adenopathy.  Skin:    Findings: No  erythema or rash.  Neurological:     Mental Status: She is oriented to person, place, and time.     Motor: No abnormal muscle tone.     Coordination: Coordination normal.  Psychiatric:        Behavior: Behavior normal.      ED Treatments / Results  Labs (all labs ordered are listed, but only abnormal results are displayed) Labs Reviewed  BASIC METABOLIC PANEL - Abnormal; Notable for the following components:      Result Value   Potassium 3.3 (*)    Glucose, Bld 119 (*)    BUN 5 (*)    All other components within normal limits  CBC -  Abnormal; Notable for the following components:   Platelets 411 (*)    All other components within normal limits  D-DIMER, QUANTITATIVE (NOT AT [facility])  HEPATIC FUNCTION PANEL  TROPONIN I (HIGH SENSITIVITY)  TROPONIN I (HIGH SENSITIVITY)    EKG EKG Interpretation  Date/Time:  Friday November 16 2018 08:41:31 EDT Ventricular Rate:  99 PR Interval:  134 QRS Duration: 78 QT Interval:  332 QTC Calculation: 426 R Axis:   69 Text Interpretation:  Normal sinus rhythm Normal ECG Confirmed by Milton Ferguson [id number]) on 11/16/2018 10:51:47 AM   Radiology Dg Chest Port 1 View  Result Date: 11/16/2018 CLINICAL DATA:  Shortness of breath, sudden onset of not being able to breathe this morning, epigastric pain, former smoker, COPD EXAM: PORTABLE CHEST 1 VIEW COMPARISON:  Portable exam 0940 hours compared to 02/05/2018 FINDINGS: Slight rotation to the RIGHT. Normal heart size, mediastinal contours, and pulmonary vascularity. Emphysematous lungs without infiltrate, pleural effusion, or pneumothorax. Bones demineralized. IMPRESSION: COPD changes without acute infiltrate. Electronically Signed   By: Lavonia Dana M.D.   On: 11/16/2018 10:07    Procedures Procedures (including critical care time)  Medications Ordered in ED Medications  albuterol (VENTOLIN HFA) 108 (90 Base) MCG/ACT inhaler 4 puff (4 puffs Inhalation Not Given 11/16/18 1211)  predniSONE  (DELTASONE) tablet 60 mg (60 mg Oral Given 11/16/18 1055)  magnesium sulfate IVPB 2 g 50 mL (2 g Intravenous New Bag/Given 11/16/18 1104)     Initial Impression / Assessment and Plan / ED Course  I have reviewed the triage vital signs and the nursing notes.  Pertinent labs & imaging results that were available during my care of the patient were reviewed by me and considered in my medical decision making (see chart for details).        Labs unremarkable.  Chest x-ray shows COPD.  Patient improved with magnesium and steroids.  She will be sent home with steroids.  Suspect exacerbation of COPD  Final Clinical Impressions(s) / ED Diagnoses   Final diagnoses:  None    ED Discharge Orders         Ordered    predniSONE (DELTASONE) 10 MG tablet  Daily     11/16/18 1330           Milton Ferguson, MD 11/16/18 1333

## 2018-11-16 NOTE — ED Notes (Signed)
ED Provider at bedside. 

## 2018-11-21 ENCOUNTER — Telehealth (HOSPITAL_COMMUNITY): Payer: Self-pay | Admitting: *Deleted

## 2018-11-21 NOTE — Telephone Encounter (Signed)
Left message on voicemail per DPR in reference to upcoming appointment scheduled on 11/28/18 with detailed instructions given per Myocardial Perfusion Study Information Sheet for the test. LM to arrive 15 minutes early, and that it is imperative to arrive on time for appointment to keep from having the test rescheduled. If you need to cancel or reschedule your appointment, please call the office within 24 hours of your appointment. Failure to do so may result in a cancellation of your appointment, and a $50 no show fee. Phone number given for call back for any questions. Kirstie Peri

## 2018-11-26 DIAGNOSIS — R69 Illness, unspecified: Secondary | ICD-10-CM | POA: Diagnosis not present

## 2018-11-28 ENCOUNTER — Other Ambulatory Visit: Payer: Self-pay

## 2018-11-28 ENCOUNTER — Ambulatory Visit (INDEPENDENT_AMBULATORY_CARE_PROVIDER_SITE_OTHER): Payer: Medicare HMO

## 2018-11-28 DIAGNOSIS — R0600 Dyspnea, unspecified: Secondary | ICD-10-CM

## 2018-11-28 DIAGNOSIS — I2510 Atherosclerotic heart disease of native coronary artery without angina pectoris: Secondary | ICD-10-CM

## 2018-11-28 DIAGNOSIS — R0789 Other chest pain: Secondary | ICD-10-CM | POA: Diagnosis not present

## 2018-11-28 DIAGNOSIS — R0609 Other forms of dyspnea: Secondary | ICD-10-CM

## 2018-11-28 DIAGNOSIS — R69 Illness, unspecified: Secondary | ICD-10-CM | POA: Diagnosis not present

## 2018-11-28 LAB — MYOCARDIAL PERFUSION IMAGING
LV dias vol: 56 mL (ref 46–106)
LV sys vol: 20 mL
Peak HR: 117 {beats}/min
Rest HR: 80 {beats}/min
SDS: 2
SRS: 3
SSS: 5
TID: 1.02

## 2018-11-28 MED ORDER — TECHNETIUM TC 99M TETROFOSMIN IV KIT
11.0000 | PACK | Freq: Once | INTRAVENOUS | Status: AC | PRN
  Administered 2018-11-28: 11 via INTRAVENOUS

## 2018-11-28 MED ORDER — REGADENOSON 0.4 MG/5ML IV SOLN
0.4000 mg | Freq: Once | INTRAVENOUS | Status: AC
  Administered 2018-11-28: 0.4 mg via INTRAVENOUS

## 2018-11-28 MED ORDER — TECHNETIUM TC 99M TETROFOSMIN IV KIT
32.9000 | PACK | Freq: Once | INTRAVENOUS | Status: AC | PRN
  Administered 2018-11-28: 32.9 via INTRAVENOUS

## 2018-12-04 LAB — FUNGUS CULTURE W SMEAR
MICRO NUMBER:: 934094
SMEAR:: NONE SEEN
SPECIMEN QUALITY:: ADEQUATE

## 2018-12-04 LAB — RESPIRATORY CULTURE OR RESPIRATORY AND SPUTUM CULTURE
MICRO NUMBER:: 934095
RESULT:: NORMAL
SPECIMEN QUALITY:: ADEQUATE

## 2018-12-07 ENCOUNTER — Other Ambulatory Visit (HOSPITAL_COMMUNITY)
Admission: RE | Admit: 2018-12-07 | Discharge: 2018-12-07 | Disposition: A | Discharge status: Home / Self Care | Payer: Medicare HMO | Source: Ambulatory Visit | Attending: Primary Care | Admitting: Primary Care

## 2018-12-07 DIAGNOSIS — Z20828 Contact with and (suspected) exposure to other viral communicable diseases: Secondary | ICD-10-CM | POA: Insufficient documentation

## 2018-12-07 DIAGNOSIS — Z01812 Encounter for preprocedural laboratory examination: Secondary | ICD-10-CM | POA: Diagnosis not present

## 2018-12-08 LAB — NOVEL CORONAVIRUS, NAA (HOSP ORDER, SEND-OUT TO REF LAB; TAT 18-24 HRS): SARS-CoV-2, NAA: NOT DETECTED

## 2018-12-10 NOTE — Progress Notes (Signed)
@Patient  ID: Amanda Cobb, female    DOB: [birth year], 68 y.o.   MRN: [id number]  Chief Complaint  Patient presents with   Follow-up    review PFT.  pt c/o increased SOB X2 mos.      Referring provider: Elenore Paddy, NP  HPI: 68 year old female, former smoker quit in 2017 (50 pack year hx). PMH significant for COPD, GERD. Patient of Dr. Valeta Harms, last seen by pulmonary NP on 02/12/18 and follows with lung cancer screening program. Started on [place] in November 2019. Diagnosed with COPD from office spirometry 5-6 years ago. No formal PFTs.  Previous [facility] pulmonary encounters: 09/04/2018- Acute televisit Patient contacted today for televisit, complains of cough with shortness of breath x 2 weeks. Currently using Trelegy daily, she feels it's been helpful until recently. She has been using her Albuterol rescue inhaler 4 times a day. Cough is productive with clear mucus. Heat worsens her symptoms. Denies fever or sick contact.   11/01/2018- Acute televisit Patient contacted today for acute televisit. LDCT on 10/10/18 showed new left upper lobe nodules measuring up to 8.54mm Lung RADS 4B, imaging results discussed with Dr. Valeta Harms. The area is felt to be too small to biopsy and barley large enough for PET imaging, plan is to repeat CT in 3 months in December and if >1cm will proceed with PET scan.   She hasn't been feeling well since July. Reports no significant improvement from abx, steriods, daliresp or nebulizer since added. Reports significant shortness of breath and fatigue in the morning and 5 hours after using her inhaler. Associated cough which is productive with clear white-green sputum. She is taking trelegy as prescribed (stiolto didn't do as well as Trelegy). Duoneb help for a short period of time. Using albuterol hfa at work which does work. She has been on Daliresp 250mg  for 28 days- prescribed by PCP. No PFTs on file, needs to schedule.   12/11/2018 Patient presents today for 1 month  follow-up with PFTs. She is at her baseline, more short of breath since March. She was changed from Trelegy to Symb/Spiriva for twice a day dosing option. Her insurance does not cover Spiriva and she is in the donut hole. She reports benefit from Albuterol but it makes her jittery. She stopped taking Daliresp d/t GI side effect and weight loss. She is not as active as she was before COVID. No active cough or upper respiratory symptoms. Afebrile.    12/11/2018 PFTs- FVC 1.52 (56%), FEV1 0.58 (28%), ratio 38, DLCO 56, +BD response  Ambulatory O2 low 92% on room air p 3 laps   Allergies  Allergen Reactions   Codeine Nausea And Vomiting    Immunization History  Administered Date(s) Administered   Influenza, High Dose Seasonal PF 11/09/2017, 11/10/2018    Past Medical History:  Diagnosis Date   COPD (chronic obstructive pulmonary disease) ([facility])    GERD (gastroesophageal reflux disease)     Tobacco History: Social History   Tobacco Use  Smoking Status Former Smoker   Packs/day: 1.00   Years: 51.00   Pack years: 51.00   Types: Cigarettes   Start date: 1965  Smokeless Tobacco Never Used  Tobacco Comment   quit in 2017   Counseling given: Not Answered Comment: quit in 2017   Outpatient Medications Prior to Visit  Medication Sig Dispense Refill   albuterol (PROVENTIL HFA;VENTOLIN HFA) 108 (90 Base) MCG/ACT inhaler Inhale into the lungs every 6 (six) hours as needed for wheezing or shortness  of breath.     ALPRAZolam (XANAX) 0.5 MG tablet TAKE 1 & 1/2 TABLET BY MOUTH AT BEDTIME  0   atorvastatin (LIPITOR) 20 MG tablet Take 20 mg by mouth every other day.  2   calcium carbonate (OS-CAL - DOSED IN MG OF ELEMENTAL CALCIUM) 1250 (500 Ca) MG tablet Take 1 tablet by mouth.     dextromethorphan-guaiFENesin (MUCINEX DM) 30-600 MG 12hr tablet Take 1 tablet by mouth 2 (two) times daily as needed for cough.     ipratropium-albuterol (DUONEB) 0.5-2.5 (3) MG/3ML SOLN INHALE 3  MILLILITER EVERY 6 HOURS AS NEEDED FOR COPD EXACERBATION     meclizine (ANTIVERT) 25 MG tablet TAKE 1 TABLET DAILY AS NEEDED FOR VERTIGO     ondansetron (ZOFRAN-ODT) 8 MG disintegrating tablet Take 8 mg by mouth every 8 (eight) hours as needed.     pantoprazole (PROTONIX) 40 MG tablet Take by mouth.     predniSONE (DELTASONE) 10 MG tablet Take 2 tablets (20 mg total) by mouth daily. 14 tablet 0   budesonide-formoterol (SYMBICORT) 160-4.5 MCG/ACT inhaler Inhale 2 puffs into the lungs every 12 (twelve) hours. 1 Inhaler 0   escitalopram (LEXAPRO) 5 MG tablet Take 1 tablet by mouth daily.     roflumilast (DALIRESP) 500 MCG TABS tablet Take 1 tablet (500 mcg total) by mouth daily. (Patient not taking: Reported on 12/11/2018) 30 tablet 3   Tiotropium Bromide Monohydrate (SPIRIVA RESPIMAT) 2.5 MCG/ACT AERS Inhale 2 puffs into the lungs daily. (Patient not taking: Reported on 12/11/2018) 1 g 0   No facility-administered medications prior to visit.       Review of Systems  Review of Systems  Respiratory: Positive for shortness of breath and wheezing. Negative for cough.      Physical Exam  BP 124/66 (BP Location: Left Arm, Cuff Size: Normal)    Pulse 96    Temp 97.9 F (36.6 C) (Oral)    SpO2 96%  Physical Exam Constitutional:      Appearance: Normal appearance.  Pulmonary:     Breath sounds: Wheezing present.     Comments: Inspiratory wheezes t/o lung field Neurological:     Mental Status: She is alert.      Lab Results:  CBC    Component Value Date/Time   WBC 9.1 11/16/2018 0844   RBC 4.45 11/16/2018 0844   HGB 12.8 11/16/2018 0844   HCT 39.5 11/16/2018 0844   PLT 411 (H) 11/16/2018 0844   MCV 88.8 11/16/2018 0844   MCH 28.8 11/16/2018 0844   MCHC 32.4 11/16/2018 0844   RDW 12.9 11/16/2018 0844    BMET    Component Value Date/Time   NA 139 11/16/2018 0844   K 3.3 (L) 11/16/2018 0844   CL 100 11/16/2018 0844   CO2 27 11/16/2018 0844   GLUCOSE 119 (H)  11/16/2018 0844   BUN 5 (L) 11/16/2018 0844   CREATININE 0.63 11/16/2018 0844   CALCIUM 9.5 11/16/2018 0844   GFRNONAA >60 11/16/2018 0844   GFRAA >60 11/16/2018 0844    BNP No results found for: BNP  ProBNP No results found for: PROBNP  Imaging: Dg Chest Port 1 View  Result Date: 11/16/2018 CLINICAL DATA:  Shortness of breath, sudden onset of not being able to breathe this morning, epigastric pain, former smoker, COPD EXAM: PORTABLE CHEST 1 VIEW COMPARISON:  Portable exam 0940 hours compared to 02/05/2018 FINDINGS: Slight rotation to the RIGHT. Normal heart size, mediastinal contours, and pulmonary vascularity. Emphysematous lungs without infiltrate,  pleural effusion, or pneumothorax. Bones demineralized. IMPRESSION: COPD changes without acute infiltrate. Electronically Signed   By: Lavonia Dana M.D.   On: 11/16/2018 10:07     Assessment & Plan:   68 year old female, former smoker (50 pack year hx). Followed for COPD/emphysema, pulmonary nodule. Reports increased shortness of breath over the last several months. PFTs today showed severe obstruction with FEV1 28%. Trelegy change during last visit to Symb/Spiriva d/t twice a day dosing option, Spiriva is not covered and she is currently in the donut hole. She is still working so changing inhalers to nebulizer's would likely not work with her schedule. She did not tolerate Daliresp d/t GI side effects and weight loss. She did not desaturate today during ambulatory walk. I think she would benefit from twice a day dosing, trial Breztri two puffs twice a day. She is due for follow-up low dose CT chest with lung cancer screening program.   Stage 4 very severe COPD by GOLD classification ([place]) - FEV1 28%, ratio 38 +BD - Trial Breztri two puffs twice daily (sample given) - Continue Albuterol hfa q 4-6 hours prn sob/wheezing - Ambulatory O2 low 92% on room air p 3 laps    Martyn Ehrich, NP 12/11/2018

## 2018-12-11 ENCOUNTER — Encounter: Payer: Self-pay | Admitting: Primary Care

## 2018-12-11 ENCOUNTER — Ambulatory Visit (INDEPENDENT_AMBULATORY_CARE_PROVIDER_SITE_OTHER): Payer: Medicare HMO | Admitting: Pulmonary Disease

## 2018-12-11 ENCOUNTER — Other Ambulatory Visit: Payer: Self-pay

## 2018-12-11 ENCOUNTER — Ambulatory Visit: Payer: Medicare HMO | Admitting: Primary Care

## 2018-12-11 ENCOUNTER — Telehealth: Payer: Self-pay | Admitting: Primary Care

## 2018-12-11 VITALS — BP 124/66 | HR 96 | Temp 97.9°F

## 2018-12-11 DIAGNOSIS — J449 Chronic obstructive pulmonary disease, unspecified: Secondary | ICD-10-CM

## 2018-12-11 LAB — PULMONARY FUNCTION TEST
DL/VA % pred: 74 %
DL/VA: 3.15 ml/min/mmHg/L
DLCO unc % pred: 56 %
DLCO unc: 10.11 ml/min/mmHg
FEF 25-75 Post: 0.31 L/sec
FEF 25-75 Pre: 0.21 L/sec
FEF2575-%Change-Post: 49 %
FEF2575-%Pred-Post: 17 %
FEF2575-%Pred-Pre: 11 %
FEV1-%Change-Post: 21 %
FEV1-%Pred-Post: 28 %
FEV1-%Pred-Pre: 23 %
FEV1-Post: 0.58 L
FEV1-Pre: 0.48 L
FEV1FVC-%Change-Post: 0 %
FEV1FVC-%Pred-Pre: 49 %
FEV6-%Change-Post: 19 %
FEV6-%Pred-Post: 57 %
FEV6-%Pred-Pre: 47 %
FEV6-Post: 1.48 L
FEV6-Pre: 1.24 L
FEV6FVC-%Change-Post: 0 %
FEV6FVC-%Pred-Post: 101 %
FEV6FVC-%Pred-Pre: 102 %
FVC-%Change-Post: 20 %
FVC-%Pred-Post: 56 %
FVC-%Pred-Pre: 46 %
FVC-Post: 1.52 L
FVC-Pre: 1.27 L
Post FEV1/FVC ratio: 38 %
Post FEV6/FVC ratio: 97 %
Pre FEV1/FVC ratio: 38 %
Pre FEV6/FVC Ratio: 98 %
RV % pred: 171 %
RV: 3.48 L
TLC % pred: 104 %
TLC: 4.86 L

## 2018-12-11 MED ORDER — BREZTRI AEROSPHERE 160-9-4.8 MCG/ACT IN AERO: 2.0000 | INHALATION_SPRAY | Freq: Two times a day (BID) | RESPIRATORY_TRACT | 6 refills | Status: DC

## 2018-12-11 NOTE — Assessment & Plan Note (Signed)
-   FEV1 28%, ratio 38 +BD - Trial Breztri two puffs twice daily (sample given) - Continue Albuterol hfa q 4-6 hours prn sob/wheezing - Ambulatory O2 low 92% on room air p 3 laps

## 2018-12-11 NOTE — Progress Notes (Signed)
PFT done today. 

## 2018-12-11 NOTE — Telephone Encounter (Signed)
Order placed.  Nothing further needed at this time- will close encounter.   

## 2018-12-11 NOTE — Patient Instructions (Addendum)
COPD: - PFTs showed stage IV COPD (severe obstruction) - Trial Breztri- two puffs twice daily (sample given) - Continue Albuterol every 4-6 hours as needed for shortness of breath/wheezing - Stay as active as possible  Follow-up: - Due for low dose CT chest in December  - FU 8 weeks with Dr. Valeta Harms    Call insurance company and ask if Trelegy or Judithann Sauger are covered; if not what LAMA and ICS/LABA is on plan   _______________________________________                      _________________________________________________      COPD and Physical Activity Chronic obstructive pulmonary disease (COPD) is a long-term (chronic) condition that affects the lungs. COPD is a general term that can be used to describe many different lung problems that cause lung swelling (inflammation) and limit airflow, including chronic bronchitis and emphysema. The main symptom of COPD is shortness of breath, which makes it harder to do even simple tasks. This can also make it harder to exercise and be active. Talk with your health care provider about treatments to help you breathe better and actions you can take to prevent breathing problems during physical activity. What are the benefits of exercising with COPD? Exercising regularly is an important part of a healthy lifestyle. You can still exercise and do physical activities even though you have COPD. Exercise and physical activity improve your shortness of breath by increasing blood flow (circulation). This causes your heart to pump more oxygen through your body. Moderate exercise can improve your:  Oxygen use.  Energy level.  Shortness of breath.  Strength in your breathing muscles.  Heart health.  Sleep.  Self-esteem and feelings of self-worth.  Depression, stress, and anxiety levels. Exercise can benefit everyone with COPD. The severity of your disease may affect how hard you can exercise, especially at first, but everyone can benefit. Talk with your  health care provider about how much exercise is safe for you, and which activities and exercises are safe for you. What actions can I take to prevent breathing problems during physical activity?  Sign up for a pulmonary rehabilitation program. This type of program may include: ? Education about lung diseases. ? Exercise classes that teach you how to exercise and be more active while improving your breathing. This usually involves:  Exercise using your lower extremities, such as a stationary bicycle.  About 30 minutes of exercise, 2 to 5 times per week, for 6 to 12 weeks  Strength training, such as push ups or leg lifts. ? Nutrition education. ? Group classes in which you can talk with others who also have COPD and learn ways to manage stress.  If you use an oxygen tank, you should use it while you exercise. Work with your health care provider to adjust your oxygen for your physical activity. Your resting flow rate is different from your flow rate during physical activity.  While you are exercising: ? Take slow breaths. ? Pace yourself and do not try to go too fast. ? Purse your lips while breathing out. Pursing your lips is similar to a kissing or whistling position. ? If doing exercise that uses a quick burst of effort, such as weight lifting:  Breathe in before starting the exercise.  Breathe out during the hardest part of the exercise (such as raising the weights). Where to find support You can find support for exercising with COPD from:  Your health care provider.  A pulmonary  rehabilitation program.  Your local health department or community health programs.  Support groups, online or in-person. Your health care provider may be able to recommend support groups. Where to find more information You can find more information about exercising with COPD from:  American Lung Association: ClassInsider.se.  COPD Foundation: https://www.rivera.net/. Contact a health care provider if:  Your  symptoms get worse.  You have chest pain.  You have nausea.  You have a fever.  You have trouble talking or catching your breath.  You want to start a new exercise program or a new activity. Summary  COPD is a general term that can be used to describe many different lung problems that cause lung swelling (inflammation) and limit airflow. This includes chronic bronchitis and emphysema.  Exercise and physical activity improve your shortness of breath by increasing blood flow (circulation). This causes your heart to provide more oxygen to your body.  Contact your health care provider before starting any exercise program or new activity. Ask your health care provider what exercises and activities are safe for you. This information is not intended to replace advice given to you by your health care provider. Make sure you discuss any questions you have with your health care provider. Document Released: 02/16/2017 Document Revised: 05/16/2018 Document Reviewed: 02/16/2017 Elsevier Patient Education  2020 [employer].

## 2018-12-11 NOTE — Telephone Encounter (Signed)
Please refer to [facility] for COPD medication management. Patient needs to be on triple therapy and cost of inhalers has been an issue

## 2018-12-12 ENCOUNTER — Other Ambulatory Visit: Payer: Self-pay

## 2018-12-12 NOTE — Progress Notes (Signed)
PCCM: thanks for seeing her Garner Nash, DO [facility] 12/12/2018 12:48 PM

## 2018-12-12 NOTE — Patient Outreach (Signed)
Telephone assessment: New referral from pulmonary: Dx: COPD and pneumonia  Reviewed medical record. Placed call to patient with no answer.  PLAN: will mail unsuccessful outreach letter and attempt again in 3 business days.  Tomasa Rand, RN, BSN, CEN [facility] [employer] [phone]

## 2018-12-14 ENCOUNTER — Other Ambulatory Visit: Payer: Medicare HMO

## 2018-12-17 ENCOUNTER — Other Ambulatory Visit: Payer: Self-pay

## 2018-12-17 NOTE — Patient Outreach (Signed)
Telephone assessment:  2nd outreach attempt unsuccessful.  PLAN: will attempt again in 3 days. Outreach letter already mailed.  Tomasa Rand, RN, BSN, CEN [facility] [employer] [phone]

## 2018-12-20 ENCOUNTER — Telehealth: Payer: Self-pay | Admitting: Primary Care

## 2018-12-20 ENCOUNTER — Other Ambulatory Visit: Payer: Self-pay

## 2018-12-20 ENCOUNTER — Ambulatory Visit (INDEPENDENT_AMBULATORY_CARE_PROVIDER_SITE_OTHER): Payer: Medicare HMO | Admitting: Pulmonary Disease

## 2018-12-20 DIAGNOSIS — J441 Chronic obstructive pulmonary disease with (acute) exacerbation: Secondary | ICD-10-CM

## 2018-12-20 MED ORDER — TRELEGY ELLIPTA 100-62.5-25 MCG/INH IN AEPB: 1.0000 | INHALATION_SPRAY | Freq: Every day | RESPIRATORY_TRACT | 3 refills | Status: DC

## 2018-12-20 MED ORDER — PREDNISONE 10 MG PO TABS: ORAL_TABLET | ORAL | 0 refills | Status: DC

## 2018-12-20 MED ORDER — AZITHROMYCIN 250 MG PO TABS: ORAL_TABLET | ORAL | 0 refills | Status: DC

## 2018-12-20 NOTE — Patient Instructions (Signed)
Thank you for visiting Dr. Valeta Harms at [facility]. Today we recommend the following:  Meds ordered this encounter  Medications  . Fluticasone-Umeclidin-Vilant (TRELEGY ELLIPTA) 100-62.5-25 MCG/INH AEPB    Sig: Inhale 1 puff into the lungs daily.    Dispense:  3 each    Refill:  3  . predniSONE (DELTASONE) 10 MG tablet    Sig: Take 4 tabs by mouth once daily x4 days, then 3 tabs x4 days, 2 tabs x4 days, 1 tab x4 days and stop.    Dispense:  40 tablet    Refill:  0  . azithromycin (ZITHROMAX) 250 MG tablet    Sig: Take as directed    Dispense:  6 tablet    Refill:  0   Return in about 6 weeks (around 01/31/2019).    Please do your part to reduce the spread of COVID-19.

## 2018-12-20 NOTE — Telephone Encounter (Signed)
See if you can get her a televisit with Dr. Valeta Harms today

## 2018-12-20 NOTE — Telephone Encounter (Signed)
Spoke with pt. States that she is not feeling well. Reports increased coughing, chest congestion and shortness of breath. Cough is producing green mucus. Denies fever/chills, congestion/runny nose, sore throat, severe headache, joint pain, unexplained muscle aches, loss of taste or smell, rash, N/V/D, abdominal pain, redness around/in the eye, increased weakness or unexplained bruising or bleeding.  She became very tearful on the phone, stated, "I just so emotional. I haven't been able to sleep in the past 2 nights." I asked if she couldn't sleep due to her coughing and she said no. Pt would like Beth's recommendations.  Beth - please advise. Thanks.

## 2018-12-20 NOTE — Progress Notes (Signed)
Synopsis: Referred in Novermber 2019 for COPD.  Subjective:   PATIENT ID: Amanda Cobb GENDER: female DOB: 07-26-1950, MRN: [id number]  Chief Complaint  Patient presents with  . Telephone visit    OV 12/2017: PMH of GERD and COPD. She was diagnosed with COPD by office spiro about 6 years ago. Quit smoking 3 years ago, smoked from age 68 to 33, about 1 ppd for 50 years.  Overall has been doing well but does notice cough on occasion.  She does have some shortness of breath.  Her cough is productive.  She denies fevers denies hemoptysis.  Her cough at times is strong enough to make her vomit.  She does have significant coughing spells worse in the mornings.  She is currently managed using a Trelegy inhaler.  She is not really sure that this made a whole lot of difference.  She used to be on Symbicort (she believes) in the past and she thought that this helped.  OV 12/20/2018: Virtual Visit via Telephone Note  I connected withJuanita Axtman on 12/20/18 at 11:30 AM EST by telephoneand verified that I am speaking with the correct person using two identifiers.  Location: Patient: Amanda Cobb  Provider: Garner Nash, DO   I discussed the limitations, risks, security and privacy concerns of performing an evaluation and management service by telephone and the availability of in person appointments. I also discussed with the patient that there may be a patient responsible charge related to this service. The patient expressed understanding and agreed to proceed.   History of Present Illness: Patient with COPD.  Increased cough sputum production shortness of breath and wheezing at home.  Observations/Objective: Patient able to speak in complete sentences.  Assessment and Plan: Acute exacerbation of COPD -Start prednisone taper -Z-Pak -Refill Trelegy  Follow Up Instructions: Please call our office if symptoms do not improve.  If she continues to have respiratory complaints cough sputum  production may need to consider chest imaging here in the office and in person office visit.   I discussed the assessment and treatment plan with the patient. The patient was provided an opportunity to ask questions and all were answered. The patient agreed with the plan and demonstrated an understanding of the instructions.  The patient was advised to call back or seek an in-person evaluation if the symptoms worsen or if the condition fails to improve as anticipated.  I provided 16 minutes of non-face-to-face time during this encounter.     Past Medical History:  Diagnosis Date  . COPD (chronic obstructive pulmonary disease) ([place])   . GERD (gastroesophageal reflux disease)      Family History  Problem Relation Age of Onset  . Dementia Mother   . Asthma Father   . COPD Sister   . Depression Sister   . Depression Sister   . COPD Sister       Past Surgical History:  Procedure Laterality Date  . COLONOSCOPY    . ESOPHAGOGASTRODUODENOSCOPY    . GALLBLADDER SURGERY    . TUBAL LIGATION      Social History   Socioeconomic History  . Marital status: Married    Spouse name: Not on file  . Number of children: Not on file  . Years of education: Not on file  . Highest education level: Not on file  Occupational History  . Not on file  Social Needs  . Financial resource strain: Not on file  . Food insecurity    Worry: Not on  file    Inability: Not on file  . Transportation needs    Medical: Not on file    Non-medical: Not on file  Tobacco Use  . Smoking status: Former Smoker    Packs/day: 1.00    Years: 51.00    Pack years: 51.00    Types: Cigarettes    Start date: 1965  . Smokeless tobacco: Never Used  . Tobacco comment: quit in 2017  Substance and Sexual Activity  . Alcohol use: Yes    Comment: Occasional, social  . Drug use: Never  . Sexual activity: Not on file  Lifestyle  . Physical activity    Days per week: Not on file    Minutes per session: Not on  file  . Stress: Not on file  Relationships  . Social Herbalist on phone: Not on file    Gets together: Not on file    Attends religious service: Not on file    Active member of club or organization: Not on file    Attends meetings of clubs or organizations: Not on file    Relationship status: Not on file  . Intimate partner violence    Fear of current or ex partner: Not on file    Emotionally abused: Not on file    Physically abused: Not on file    Forced sexual activity: Not on file  Other Topics Concern  . Not on file  Social History Narrative  . Not on file     Allergies  Allergen Reactions  . Codeine Nausea And Vomiting     Outpatient Medications Prior to Visit  Medication Sig Dispense Refill  . albuterol (PROVENTIL HFA;VENTOLIN HFA) 108 (90 Base) MCG/ACT inhaler Inhale into the lungs every 6 (six) hours as needed for wheezing or shortness of breath.    . ALPRAZolam (XANAX) 0.5 MG tablet TAKE 1 & 1/2 TABLET BY MOUTH AT BEDTIME  0  . atorvastatin (LIPITOR) 20 MG tablet Take 20 mg by mouth every other day.  2  . Budeson-Glycopyrrol-Formoterol (BREZTRI AEROSPHERE) 160-9-4.8 MCG/ACT AERO Inhale 2 puffs into the lungs 2 (two) times daily. 5.9 g 6  . calcium carbonate (OS-CAL - DOSED IN MG OF ELEMENTAL CALCIUM) 1250 (500 Ca) MG tablet Take 1 tablet by mouth.    . dextromethorphan-guaiFENesin (MUCINEX DM) 30-600 MG 12hr tablet Take 1 tablet by mouth 2 (two) times daily as needed for cough.    Marland Kitchen ipratropium-albuterol (DUONEB) 0.5-2.5 (3) MG/3ML SOLN INHALE 3 MILLILITER EVERY 6 HOURS AS NEEDED FOR COPD EXACERBATION    . meclizine (ANTIVERT) 25 MG tablet TAKE 1 TABLET DAILY AS NEEDED FOR VERTIGO    . ondansetron (ZOFRAN-ODT) 8 MG disintegrating tablet Take 8 mg by mouth every 8 (eight) hours as needed.    . pantoprazole (PROTONIX) 40 MG tablet Take by mouth.    . predniSONE (DELTASONE) 10 MG tablet Take 2 tablets (20 mg total) by mouth daily. 14 tablet 0   No  facility-administered medications prior to visit.     ROS   Objective:  Physical Exam   There were no vitals filed for this visit.   on RA BMI Readings from Last 3 Encounters:  11/28/18 19.31 kg/m  11/16/18 18.66 kg/m  11/08/18 19.31 kg/m   Wt Readings from Last 3 Encounters:  11/28/18 109 lb (49.4 kg)  11/16/18 102 lb (46.3 kg)  11/08/18 109 lb (49.4 kg)     CBC    Component Value Date/Time  WBC 9.1 11/16/2018 0844   RBC 4.45 11/16/2018 0844   HGB 12.8 11/16/2018 0844   HCT 39.5 11/16/2018 0844   PLT 411 (H) 11/16/2018 0844   MCV 88.8 11/16/2018 0844   MCH 28.8 11/16/2018 0844   MCHC 32.4 11/16/2018 0844   RDW 12.9 11/16/2018 0844    Chest Imaging: No prior chest extending to review  Pulmonary Functions Testing Results:  PFT Results Latest Ref Rng & Units 12/11/2018  FVC-Pre L 1.27  FVC-Predicted Pre % 46  FVC-Post L 1.52  FVC-Predicted Post % 56  Pre FEV1/FVC % % 38  Post FEV1/FCV % % 38  FEV1-Pre L 0.48  FEV1-Predicted Pre % 23  FEV1-Post L 0.58  DLCO UNC% % 56  DLCO COR %Predicted % 74  TLC L 4.86  TLC % Predicted % 104  RV % Predicted % 171    FeNO: None   Pathology: None   Echocardiogram: None   Heart Catheterization: None     Assessment & Plan:   COPD with acute exacerbation (HCC)    Current Outpatient Medications:  .  albuterol (PROVENTIL HFA;VENTOLIN HFA) 108 (90 Base) MCG/ACT inhaler, Inhale into the lungs every 6 (six) hours as needed for wheezing or shortness of breath., Disp: , Rfl:  .  ALPRAZolam (XANAX) 0.5 MG tablet, TAKE 1 & 1/2 TABLET BY MOUTH AT BEDTIME, Disp: , Rfl: 0 .  atorvastatin (LIPITOR) 20 MG tablet, Take 20 mg by mouth every other day., Disp: , Rfl: 2 .  Budeson-Glycopyrrol-Formoterol (BREZTRI AEROSPHERE) 160-9-4.8 MCG/ACT AERO, Inhale 2 puffs into the lungs 2 (two) times daily., Disp: 5.9 g, Rfl: 6 .  calcium carbonate (OS-CAL - DOSED IN MG OF ELEMENTAL CALCIUM) 1250 (500 Ca) MG tablet, Take 1 tablet by  mouth., Disp: , Rfl:  .  dextromethorphan-guaiFENesin (MUCINEX DM) 30-600 MG 12hr tablet, Take 1 tablet by mouth 2 (two) times daily as needed for cough., Disp: , Rfl:  .  ipratropium-albuterol (DUONEB) 0.5-2.5 (3) MG/3ML SOLN, INHALE 3 MILLILITER EVERY 6 HOURS AS NEEDED FOR COPD EXACERBATION, Disp: , Rfl:  .  meclizine (ANTIVERT) 25 MG tablet, TAKE 1 TABLET DAILY AS NEEDED FOR VERTIGO, Disp: , Rfl:  .  ondansetron (ZOFRAN-ODT) 8 MG disintegrating tablet, Take 8 mg by mouth every 8 (eight) hours as needed., Disp: , Rfl:  .  pantoprazole (PROTONIX) 40 MG tablet, Take by mouth., Disp: , Rfl:  .  predniSONE (DELTASONE) 10 MG tablet, Take 2 tablets (20 mg total) by mouth daily., Disp: 14 tablet, Rfl: 0 .  azithromycin (ZITHROMAX) 250 MG tablet, Take as directed, Disp: 6 tablet, Rfl: 0 .  Fluticasone-Umeclidin-Vilant (TRELEGY ELLIPTA) 100-62.5-25 MCG/INH AEPB, Inhale 1 puff into the lungs daily., Disp: 3 each, Rfl: 3 .  predniSONE (DELTASONE) 10 MG tablet, Take 4 tabs by mouth once daily x4 days, then 3 tabs x4 days, 2 tabs x4 days, 1 tab x4 days and stop., Disp: 40 tablet, Rfl: 0   Garner Nash, DO  Pulmonary Critical Care 12/20/2018 11:48 AM

## 2018-12-20 NOTE — Patient Outreach (Signed)
Telephone assessment:  Attempt to reach patient was unsuccessful.  This was 3rd attempt. Letter already mailed.  PLAN: will plan to close case if no response by 12/26/2018  Tomasa Rand, RN, BSN, CEN [facility] [phone]

## 2018-12-20 NOTE — Telephone Encounter (Signed)
Spoke with pt. She is aware of Beth's recommendation. Pt has been scheduled for a televisit with Dr. Valeta Harms today at 1130. Nothing further was needed.

## 2018-12-24 ENCOUNTER — Other Ambulatory Visit: Payer: Self-pay

## 2018-12-24 DIAGNOSIS — R918 Other nonspecific abnormal finding of lung field: Secondary | ICD-10-CM

## 2018-12-24 NOTE — Progress Notes (Unsigned)
bmet  

## 2018-12-25 DIAGNOSIS — Z681 Body mass index (BMI) 19 or less, adult: Secondary | ICD-10-CM | POA: Diagnosis not present

## 2018-12-25 DIAGNOSIS — J432 Centrilobular emphysema: Secondary | ICD-10-CM | POA: Diagnosis not present

## 2018-12-25 DIAGNOSIS — H6991 Unspecified Eustachian tube disorder, right ear: Secondary | ICD-10-CM | POA: Diagnosis not present

## 2018-12-25 DIAGNOSIS — R69 Illness, unspecified: Secondary | ICD-10-CM | POA: Diagnosis not present

## 2018-12-25 DIAGNOSIS — J449 Chronic obstructive pulmonary disease, unspecified: Secondary | ICD-10-CM | POA: Diagnosis not present

## 2018-12-26 ENCOUNTER — Other Ambulatory Visit: Payer: Self-pay

## 2018-12-26 DIAGNOSIS — R69 Illness, unspecified: Secondary | ICD-10-CM | POA: Diagnosis not present

## 2018-12-26 NOTE — Patient Outreach (Signed)
Case closure:  Unable to reach patient via phone or letter.   PLAN: case closed. Will send MD letter.  Tomasa Rand, RN, BSN, CEN [facility] [employer] [phone]

## 2018-12-27 ENCOUNTER — Ambulatory Visit: Payer: Medicare HMO | Admitting: Cardiology

## 2019-01-09 ENCOUNTER — Inpatient Hospital Stay: Admission: RE | Admit: 2019-01-09 | Payer: Medicare HMO | Source: Ambulatory Visit

## 2019-01-15 DIAGNOSIS — H6120 Impacted cerumen, unspecified ear: Secondary | ICD-10-CM | POA: Diagnosis not present

## 2019-01-15 DIAGNOSIS — M810 Age-related osteoporosis without current pathological fracture: Secondary | ICD-10-CM | POA: Diagnosis not present

## 2019-01-15 DIAGNOSIS — H6991 Unspecified Eustachian tube disorder, right ear: Secondary | ICD-10-CM | POA: Diagnosis not present

## 2019-01-15 DIAGNOSIS — R0982 Postnasal drip: Secondary | ICD-10-CM | POA: Diagnosis not present

## 2019-01-15 DIAGNOSIS — Z681 Body mass index (BMI) 19 or less, adult: Secondary | ICD-10-CM | POA: Diagnosis not present

## 2019-01-15 DIAGNOSIS — J449 Chronic obstructive pulmonary disease, unspecified: Secondary | ICD-10-CM | POA: Diagnosis not present

## 2019-01-15 DIAGNOSIS — J309 Allergic rhinitis, unspecified: Secondary | ICD-10-CM | POA: Diagnosis not present

## 2019-01-15 DIAGNOSIS — R490 Dysphonia: Secondary | ICD-10-CM | POA: Diagnosis not present

## 2019-01-15 DIAGNOSIS — R69 Illness, unspecified: Secondary | ICD-10-CM | POA: Diagnosis not present

## 2019-01-26 DIAGNOSIS — R69 Illness, unspecified: Secondary | ICD-10-CM | POA: Diagnosis not present

## 2019-02-07 DIAGNOSIS — Z789 Other specified health status: Secondary | ICD-10-CM | POA: Diagnosis not present

## 2019-02-07 DIAGNOSIS — Z8669 Personal history of other diseases of the nervous system and sense organs: Secondary | ICD-10-CM | POA: Diagnosis not present

## 2019-02-07 DIAGNOSIS — H9311 Tinnitus, right ear: Secondary | ICD-10-CM | POA: Diagnosis not present

## 2019-02-11 ENCOUNTER — Telehealth: Payer: Self-pay | Admitting: Acute Care

## 2019-02-11 NOTE — Telephone Encounter (Signed)
Called and spoke with pt who was calling to see if she could get the CT scheduled. Pt said she had originally received documentation from insurance stating that it had been denied coverage but then she received documentation that the scan had been approved. Due to this, pt wants to see if the ct can be scheduled. Stated to pt that I would route this to [facility] to see if the could get pt scheduled for the CT. PCCs, please advise.    While speaking with pt, pt said she has had hoarseness x3-4 weeks and stated she read side effects of the breztri inhaler and saw that hoarseness is a side effect on there. Beth, please advise on this.

## 2019-02-12 ENCOUNTER — Telehealth: Payer: Self-pay | Admitting: Acute Care

## 2019-02-12 NOTE — Telephone Encounter (Signed)
Chest ct was approved and schedulked for 02/21/19#lhc pt is aware Joellen Jersey

## 2019-02-12 NOTE — Telephone Encounter (Signed)
I have sent an appeal to [employer] ins to try to get an approval and have not hear back yet Amanda Cobb

## 2019-02-12 NOTE — Telephone Encounter (Signed)
Spoke with pt, she states the insurance will not pay for [facility] and is wondering if she could go back on Trelegy/Symbicort and use a breathing treatment at night. She feels like she needs something twice daily if possible. SG please advise.

## 2019-02-12 NOTE — Telephone Encounter (Signed)
I called and spoke with the patient and made her aware to use a spacer and make sure that she rinses her mouth out after use. She verbalized understanding.

## 2019-02-12 NOTE — Telephone Encounter (Signed)
It can be a side effect, recommend using spacer and rinsing mouth after use. It is not necessarily a reason to stop this medication.

## 2019-02-13 NOTE — Telephone Encounter (Signed)
Spoke with the pt and notified of recs per Dr Valeta Harms  She verbalized understanding  Nothing further needed

## 2019-02-13 NOTE — Telephone Encounter (Signed)
Patient choice. I am fine with either.  Trelegy is appropriate if she likes it  Garner Nash, DO [place] [facility] 02/13/2019 10:53 AM

## 2019-02-13 NOTE — Telephone Encounter (Signed)
She can go back on Trelegy and use this once daily, with a breathing treatment at night, or she can resume Symbicort with the twice daily dosing with the addition of Spiriva to make this triple therapy. I am sending to Dr. Valeta Harms to see what he prefers she changes to as he sees the patient in clinic. Dr. Valeta Harms, what do you prefer ? There is also the option of sending in the paperwork for financial assistance paperwork through [employer] to see if that makes a difference for her. Thanks

## 2019-02-14 ENCOUNTER — Other Ambulatory Visit (INDEPENDENT_AMBULATORY_CARE_PROVIDER_SITE_OTHER): Payer: Medicare HMO

## 2019-02-14 DIAGNOSIS — R918 Other nonspecific abnormal finding of lung field: Secondary | ICD-10-CM

## 2019-02-14 LAB — BASIC METABOLIC PANEL
BUN: 15 mg/dL (ref 6–23)
CO2: 29 mEq/L (ref 19–32)
Calcium: 9 mg/dL (ref 8.4–10.5)
Chloride: 103 mEq/L (ref 96–112)
Creatinine, Ser: 0.58 mg/dL (ref 0.40–1.20)
GFR: 102.83 mL/min (ref >60.00)
Glucose, Bld: 106 mg/dL — ABNORMAL HIGH (ref 70–99)
Potassium: 3.1 mEq/L — ABNORMAL LOW (ref 3.5–5.1)
Sodium: 139 mEq/L (ref 135–145)

## 2019-02-15 NOTE — Progress Notes (Signed)
I think for the CT scan that was order? That's my guess.   Triage - Please let patient know potassium level was a little low- advise she increase foods such as bananas, oranges, melon, raisins/prunes, sweet potato's, green leafy vegetables, cucumbers in her diet.

## 2019-02-19 ENCOUNTER — Telehealth: Payer: Self-pay | Admitting: Pulmonary Disease

## 2019-02-19 NOTE — Telephone Encounter (Signed)
Advised pt of results. Pt understood and nothing further is needed.   

## 2019-02-20 ENCOUNTER — Inpatient Hospital Stay: Admission: RE | Admit: 2019-02-20 | Payer: Medicare HMO | Source: Ambulatory Visit

## 2019-02-21 ENCOUNTER — Other Ambulatory Visit: Payer: Self-pay

## 2019-02-21 ENCOUNTER — Ambulatory Visit (INDEPENDENT_AMBULATORY_CARE_PROVIDER_SITE_OTHER)
Admission: RE | Admit: 2019-02-21 | Discharge: 2019-02-21 | Disposition: A | Discharge status: Home / Self Care | Payer: Medicare HMO | Source: Ambulatory Visit | Attending: Acute Care | Admitting: Acute Care

## 2019-02-21 DIAGNOSIS — R918 Other nonspecific abnormal finding of lung field: Secondary | ICD-10-CM

## 2019-02-21 DIAGNOSIS — J449 Chronic obstructive pulmonary disease, unspecified: Secondary | ICD-10-CM | POA: Diagnosis not present

## 2019-02-21 MED ORDER — IOHEXOL 300 MG/ML  SOLN
80.0000 mL | Freq: Once | INTRAMUSCULAR | Status: AC | PRN
  Administered 2019-02-21: 80 mL via INTRAVENOUS

## 2019-02-28 ENCOUNTER — Other Ambulatory Visit: Payer: Self-pay | Admitting: *Deleted

## 2019-02-28 DIAGNOSIS — Z87891 Personal history of nicotine dependence: Secondary | ICD-10-CM

## 2019-03-05 NOTE — Progress Notes (Signed)
Can we repeat BMET to check potassium level

## 2019-03-08 ENCOUNTER — Telehealth: Payer: Self-pay | Admitting: Pulmonary Disease

## 2019-03-08 DIAGNOSIS — E876 Hypokalemia: Secondary | ICD-10-CM

## 2019-03-08 NOTE — Telephone Encounter (Signed)
Spoke with the pt  Will send her BMET order to [facility] per her request to the fax number provided  Nothing further needed

## 2019-03-19 DIAGNOSIS — R69 Illness, unspecified: Secondary | ICD-10-CM | POA: Diagnosis not present

## 2019-04-11 ENCOUNTER — Other Ambulatory Visit: Payer: Self-pay

## 2019-05-06 DIAGNOSIS — Z681 Body mass index (BMI) 19 or less, adult: Secondary | ICD-10-CM | POA: Diagnosis not present

## 2019-05-06 DIAGNOSIS — J449 Chronic obstructive pulmonary disease, unspecified: Secondary | ICD-10-CM | POA: Diagnosis not present

## 2019-05-06 DIAGNOSIS — R69 Illness, unspecified: Secondary | ICD-10-CM | POA: Diagnosis not present

## 2019-05-06 DIAGNOSIS — J441 Chronic obstructive pulmonary disease with (acute) exacerbation: Secondary | ICD-10-CM | POA: Diagnosis not present

## 2019-05-14 ENCOUNTER — Telehealth: Payer: Self-pay | Admitting: Adult Health

## 2019-05-14 ENCOUNTER — Ambulatory Visit: Payer: Medicare HMO | Admitting: Adult Health

## 2019-05-14 NOTE — Telephone Encounter (Signed)
I just did the Peer to peer they denied it .

## 2019-05-14 NOTE — Telephone Encounter (Signed)
Peer to Peer required for CT chest - it was denied by insurance says they will not cover d/t lung nodule size is too small. Only cover CT chest 1 year if nodule is <6 mm .

## 2019-05-14 NOTE — Telephone Encounter (Signed)
Can we arrange for a peer to peer for this patient? Thanks

## 2019-05-23 ENCOUNTER — Inpatient Hospital Stay: Admission: RE | Admit: 2019-05-23 | Payer: Medicare HMO | Source: Ambulatory Visit

## 2019-05-27 ENCOUNTER — Telehealth: Payer: Self-pay | Admitting: Pulmonary Disease

## 2019-05-27 MED ORDER — PREDNISONE 10 MG PO TABS: ORAL_TABLET | ORAL | 0 refills | Status: DC

## 2019-05-27 NOTE — Telephone Encounter (Signed)
Called pt and advised message from the provider. Pt understood and verbalized understanding. Nothing further is needed.   Appt made-virtual visit with [facility].

## 2019-05-27 NOTE — Telephone Encounter (Signed)
We will send in a prednisone taper. She needs a 1 week follow-up. In office preferred if she has been vaccinated. Present to ED if breathing acutely worsen.

## 2019-05-27 NOTE — Telephone Encounter (Signed)
Called and spoke with pt who stated she has had complaints of SOB and chest tightness x2 weeks. Pt stated she is taking her trelegy as prescribed and is having to do at least 3 breathing tx a day.  Pt denies any complaints of fever, body aches, or chills.  Pt also denies any complaints of wheezing. Pt states she has been coughing some but that has calmed down due to the breathing treatments.  Pt stated she went to PCP about 1-2 weeks ago and was put on a 5 day course of prednisone as well as a 5 day course of zpak which helped with her symptoms but pt stated about 1 week after finishing meds, symptoms returned.  Pt wants to know what we recommend to help with her symptoms. Beth, please advise.

## 2019-06-04 ENCOUNTER — Ambulatory Visit (INDEPENDENT_AMBULATORY_CARE_PROVIDER_SITE_OTHER): Payer: Medicare HMO | Admitting: Primary Care

## 2019-06-04 ENCOUNTER — Encounter: Payer: Self-pay | Admitting: Primary Care

## 2019-06-04 ENCOUNTER — Other Ambulatory Visit: Payer: Self-pay

## 2019-06-04 DIAGNOSIS — J441 Chronic obstructive pulmonary disease with (acute) exacerbation: Secondary | ICD-10-CM

## 2019-06-04 MED ORDER — BENZONATATE 100 MG PO CAPS: 200.0000 mg | ORAL_CAPSULE | Freq: Three times a day (TID) | ORAL | 1 refills | Status: DC | PRN

## 2019-06-04 MED ORDER — MONTELUKAST SODIUM 10 MG PO TABS: 10.0000 mg | ORAL_TABLET | Freq: Every day | ORAL | 6 refills | Status: DC

## 2019-06-04 NOTE — Patient Instructions (Signed)
Recommend: Continue Trelegy 100- take one puff once daily Complete prednisone taper as prescribed   Rx: Singulair 10mg  daily   Follow-up: 3-4 months with Dr. Valeta Harms

## 2019-06-04 NOTE — Progress Notes (Signed)
Virtual Visit via Video Note  I connected with Amanda Cobb on 06/04/19 at  4:00 PM EDT by a video enabled telemedicine application and verified that I am speaking with the correct person using two identifiers.  Location: Patient: Home Provider: Office   I discussed the limitations of evaluation and management by telemedicine and the availability of in person appointments. The patient expressed understanding and agreed to proceed.  History of Present Illness: 69 year old female, former smoker. PMH significant for stage 4 COPD. Patient of Dr. Valeta Harms, last seen on in November 2020. Maintained on Trelegy 100 and prn albuterol.   Previous [facility] pulmonary encounter:  Previous [facility] pulmonary encounters: 09/04/2018- Acute televisit Patient contacted today for televisit, complains of cough with shortness of breath x 2 weeks. Currently using Trelegy daily, she feels it's been helpful until recently. She has been using her Albuterol rescue inhaler 4 times a day. Cough is productive with clear mucus. Heat worsens her symptoms. Denies fever or sick contact.   11/01/2018- Acute televisit Patient contacted today for acute televisit. LDCT on 10/10/18 showed new left upper lobe nodules measuring up to 8.58mm Lung RADS 4B, imaging results discussed with Dr. Valeta Harms. The area is felt to be too small to biopsy and barley large enough for PET imaging, plan is to repeat CT in 3 months in December and if >1cm will proceed with PET scan.   She hasn't been feeling well since July. Reports no significant improvement from abx, steriods, daliresp or nebulizer since added. Reports significant shortness of breath and fatigue in the morning and 5 hours after using her inhaler. Associated cough which is productive with clear white-green sputum. She is taking trelegy as prescribed (stiolto didn't do as well as Trelegy). Duoneb help for a short period of time. Using albuterol hfa at work which does work. She has been on Daliresp 250mg  for  28 days- prescribed by PCP. No PFTs on file, needs to schedule.   12/11/2018 Patient presents today for 1 month follow-up with PFTs. She is at her baseline, more short of breath since March. She was changed from Trelegy to Symb/Spiriva for twice a day dosing option. Her insurance does not cover Spiriva and she is in the donut hole. She reports benefit from Albuterol but it makes her jittery. She stopped taking Daliresp d/t GI side effect and weight loss. She is not as active as she was before COVID. No active cough or upper respiratory symptoms. Afebrile.   OV 12/20/2018: Virtual Visit via Telephone Note History of Present Illness: Patient with COPD.  Increased cough sputum production shortness of breath and wheezing at home. Observations/Objective: Patient able to speak in complete sentences. Assessment and Plan: Acute exacerbation of COPD -Start prednisone taper -Z-Pak -Refill Trelegy  06/04/2019 Patient called office on 4/19 with reports of shortness of breath and chest tightness x 2 weeks. She was sent in prednisone taper and recommended follow-up visit d/t not being seen since November 2020. She is maintained on Trelegy 100. She recently went on a grils trip to [place], [place] on 4/22. She does report improvement from oral steriod, she has 7 days left of taper. She does have post nasal drip, she is using flonase nasal spray. She has not received the covid vaccine.    Observations/Objective:  PFTs  12/11/18- FVC 1.52 (56), FEV1 0.58 (28%), ratio 50, significant bronchodilator response   Assessment and Plan:  Severe COPD: - Recent COPD exacerbation treated with prednisone taper  - Continue Trelegy Ellipta  100, take one puff daily (consider increasing 200) - Refill tessalon perles q 8 hours prn cough - Add Singular 10mg  daily qhs   Follow Up Instructions:   - 3-4 month follow-up with Dr. Valeta Harms   I discussed the assessment and treatment plan with the patient. The patient was  provided an opportunity to ask questions and all were answered. The patient agreed with the plan and demonstrated an understanding of the instructions.   The patient was advised to call back or seek an in-person evaluation if the symptoms worsen or if the condition fails to improve as anticipated.  I provided 18 minutes of non-face-to-face time during this encounter.   Martyn Ehrich, NP

## 2019-06-05 NOTE — Progress Notes (Signed)
PCCM: Thanks for seeing Garner Nash, DO [place] [facility] 06/05/2019 4:23 PM

## 2019-06-13 DIAGNOSIS — J441 Chronic obstructive pulmonary disease with (acute) exacerbation: Secondary | ICD-10-CM | POA: Diagnosis not present

## 2019-06-13 DIAGNOSIS — R69 Illness, unspecified: Secondary | ICD-10-CM | POA: Diagnosis not present

## 2019-06-13 DIAGNOSIS — Z681 Body mass index (BMI) 19 or less, adult: Secondary | ICD-10-CM | POA: Diagnosis not present

## 2019-06-13 DIAGNOSIS — K219 Gastro-esophageal reflux disease without esophagitis: Secondary | ICD-10-CM | POA: Diagnosis not present

## 2019-06-13 DIAGNOSIS — J449 Chronic obstructive pulmonary disease, unspecified: Secondary | ICD-10-CM | POA: Diagnosis not present

## 2019-06-18 ENCOUNTER — Other Ambulatory Visit: Payer: Self-pay | Admitting: Primary Care

## 2019-07-18 DIAGNOSIS — J449 Chronic obstructive pulmonary disease, unspecified: Secondary | ICD-10-CM | POA: Diagnosis not present

## 2019-07-18 DIAGNOSIS — J441 Chronic obstructive pulmonary disease with (acute) exacerbation: Secondary | ICD-10-CM | POA: Diagnosis not present

## 2019-07-18 DIAGNOSIS — K5090 Crohn's disease, unspecified, without complications: Secondary | ICD-10-CM | POA: Diagnosis not present

## 2019-07-18 DIAGNOSIS — Z1231 Encounter for screening mammogram for malignant neoplasm of breast: Secondary | ICD-10-CM | POA: Diagnosis not present

## 2019-07-18 DIAGNOSIS — Z681 Body mass index (BMI) 19 or less, adult: Secondary | ICD-10-CM | POA: Diagnosis not present

## 2019-07-23 ENCOUNTER — Encounter: Payer: Self-pay | Admitting: Gastroenterology

## 2019-07-24 DIAGNOSIS — H52223 Regular astigmatism, bilateral: Secondary | ICD-10-CM | POA: Diagnosis not present

## 2019-07-24 DIAGNOSIS — H18712 Corneal ectasia, left eye: Secondary | ICD-10-CM | POA: Diagnosis not present

## 2019-07-24 DIAGNOSIS — H1789 Other corneal scars and opacities: Secondary | ICD-10-CM | POA: Diagnosis not present

## 2019-07-24 DIAGNOSIS — H2513 Age-related nuclear cataract, bilateral: Secondary | ICD-10-CM | POA: Diagnosis not present

## 2019-07-24 DIAGNOSIS — H524 Presbyopia: Secondary | ICD-10-CM | POA: Diagnosis not present

## 2019-07-24 DIAGNOSIS — H5203 Hypermetropia, bilateral: Secondary | ICD-10-CM | POA: Diagnosis not present

## 2019-07-24 DIAGNOSIS — H18603 Keratoconus, unspecified, bilateral: Secondary | ICD-10-CM | POA: Diagnosis not present

## 2019-09-10 ENCOUNTER — Telehealth: Payer: Self-pay | Admitting: Pulmonary Disease

## 2019-09-10 NOTE — Telephone Encounter (Signed)
Called and spoke with patient letting her know that we are currently out of samples of Trelegy 100. Informed her that we should be hopefully getting some soon for her to check back with the office later in the week to see if we have received them. She states she also called her PCP and they are out of samples as well. Patient expressed understanding.  Dr. Valeta Harms patient states that she is in the donuthole and that's why she needs samples. She has Breztri at home and is wondering if she can use that until we can get her sameples of Trelegy. Please advise

## 2019-09-11 NOTE — Telephone Encounter (Signed)
Yes okay to use breztri until she can get back on trelegy  Thanks  Hopkins, DO [place] [facility] 09/11/2019 7:20 AM

## 2019-09-11 NOTE — Telephone Encounter (Signed)
Called and spoke with patient letting her know that Dr. Valeta Harms is ok with her using Breztri until she can get Trelegy. Patient expressed understanding and asked her to check back with the office to see if we have got any samples. Nothing further needed at this time.

## 2019-09-16 DIAGNOSIS — R69 Illness, unspecified: Secondary | ICD-10-CM | POA: Diagnosis not present

## 2019-09-18 ENCOUNTER — Other Ambulatory Visit (INDEPENDENT_AMBULATORY_CARE_PROVIDER_SITE_OTHER): Payer: Medicare HMO

## 2019-09-18 ENCOUNTER — Other Ambulatory Visit: Payer: Self-pay

## 2019-09-18 ENCOUNTER — Ambulatory Visit (INDEPENDENT_AMBULATORY_CARE_PROVIDER_SITE_OTHER): Payer: Medicare HMO | Admitting: Gastroenterology

## 2019-09-18 ENCOUNTER — Encounter: Payer: Self-pay | Admitting: Gastroenterology

## 2019-09-18 VITALS — BP 118/70 | HR 77 | Ht 62.0 in | Wt 105.5 lb

## 2019-09-18 DIAGNOSIS — R1310 Dysphagia, unspecified: Secondary | ICD-10-CM | POA: Diagnosis not present

## 2019-09-18 DIAGNOSIS — R1032 Left lower quadrant pain: Secondary | ICD-10-CM | POA: Diagnosis not present

## 2019-09-18 DIAGNOSIS — K219 Gastro-esophageal reflux disease without esophagitis: Secondary | ICD-10-CM | POA: Diagnosis not present

## 2019-09-18 DIAGNOSIS — Z01818 Encounter for other preprocedural examination: Secondary | ICD-10-CM | POA: Diagnosis not present

## 2019-09-18 LAB — COMPREHENSIVE METABOLIC PANEL
ALT: 17 U/L (ref 0–35)
AST: 20 U/L (ref 0–37)
Albumin: 4.1 g/dL (ref 3.5–5.2)
Alkaline Phosphatase: 73 U/L (ref 39–117)
BUN: 15 mg/dL (ref 6–23)
CO2: 28 mEq/L (ref 19–32)
Calcium: 9.3 mg/dL (ref 8.4–10.5)
Chloride: 101 mEq/L (ref 96–112)
Creatinine, Ser: 0.58 mg/dL (ref 0.40–1.20)
GFR: 102.65 mL/min (ref >60.00)
Glucose, Bld: 86 mg/dL (ref 70–99)
Potassium: 4.4 mEq/L (ref 3.5–5.1)
Sodium: 136 mEq/L (ref 135–145)
Total Bilirubin: 0.4 mg/dL (ref 0.2–1.2)
Total Protein: 7.1 g/dL (ref 6.0–8.3)

## 2019-09-18 LAB — CBC WITH DIFFERENTIAL/PLATELET
Basophils Absolute: 0.1 10*3/uL (ref 0.0–0.1)
Basophils Relative: 0.8 % (ref 0.0–3.0)
Eosinophils Absolute: 0.4 10*3/uL (ref 0.0–0.7)
Eosinophils Relative: 5 % (ref 0.0–5.0)
HCT: 36.2 % (ref 36.0–46.0)
Hemoglobin: 12.2 g/dL (ref 12.0–15.0)
Lymphocytes Relative: 20.7 % (ref 12.0–46.0)
Lymphs Abs: 1.4 10*3/uL (ref 0.7–4.0)
MCHC: 33.6 g/dL (ref 30.0–36.0)
MCV: 85.7 fl (ref 78.0–100.0)
Monocytes Absolute: 0.8 10*3/uL (ref 0.1–1.0)
Monocytes Relative: 11.3 % (ref 3.0–12.0)
Neutro Abs: 4.3 10*3/uL (ref 1.4–7.7)
Neutrophils Relative %: 62.2 % (ref 43.0–77.0)
Platelets: 328 10*3/uL (ref 150.0–400.0)
RBC: 4.23 Mil/uL (ref 3.87–5.11)
RDW: 14.7 % (ref 11.5–15.5)
WBC: 7 10*3/uL (ref 4.0–10.5)

## 2019-09-18 LAB — C-REACTIVE PROTEIN: CRP: <1 mg/dL (ref 0.5–20.0)

## 2019-09-18 MED ORDER — PANTOPRAZOLE SODIUM 40 MG PO TBEC
40.0000 mg | DELAYED_RELEASE_TABLET | Freq: Every day | ORAL | 3 refills | Status: DC
Start: 2019-09-18 — End: 2020-09-29

## 2019-09-18 MED ORDER — FAMOTIDINE 40 MG PO TABS
40.0000 mg | ORAL_TABLET | Freq: Every day | ORAL | 3 refills | Status: DC
Start: 2019-09-18 — End: 2020-06-11

## 2019-09-18 MED ORDER — PANTOPRAZOLE SODIUM 40 MG PO TBEC: 40.0000 mg | DELAYED_RELEASE_TABLET | Freq: Every day | ORAL | 3 refills | Status: DC

## 2019-09-18 NOTE — Patient Instructions (Addendum)
If you are age 70 or older, your body mass index should be between 23-30. Your Body mass index is 19.3 kg/m. If this is out of the aforementioned range listed, please consider follow up with your Primary Care Provider.  If you are age 63 or younger, your body mass index should be between 19-25. Your Body mass index is 19.3 kg/m. If this is out of the aformentioned range listed, please consider follow up with your Primary Care Provider.   You have been scheduled for an endoscopy and colonoscopy. Please follow the written instructions given to you at your visit today. Please pick up your prep supplies at the pharmacy within the next 1-3 days. If you use inhalers (even only as needed), please bring them with you on the day of your procedure.  Your provider has requested that you go to the basement level for lab work at [address], [place]. Press "B" on the elevator. The lab is located at the first door on the left as you exit the elevator.  You have been scheduled for a CT scan of the abdomen and pelvis at [facility].  You are scheduled on 09/24/19 at 12:45. You should arrive 15 minutes prior to your appointment time for registration. Please follow the written instructions below on the day of your exam:  WARNING: IF YOU ARE ALLERGIC TO IODINE/X-RAY DYE, PLEASE NOTIFY RADIOLOGY IMMEDIATELY AT [phone]! YOU WILL BE GIVEN A 13 HOUR PREMEDICATION PREP.  1) Do not eat or drink anything after 8:45 am (4 hours prior to your test)  You may take any medications as prescribed with a small amount of water, if necessary. If you take any of the following medications: METFORMIN, GLUCOPHAGE, GLUCOVANCE, AVANDAMET, RIOMET, FORTAMET, [place]bud MET, JANUMET, GLUMETZA or METAGLIP, you MAY be asked to HOLD this medication 48 hours AFTER the exam.  The purpose of you drinking the oral contrast is to aid in the visualization of your intestinal tract. The contrast solution may cause some  diarrhea. Depending on your individual set of symptoms, you may also receive an intravenous injection of x-ray contrast/dye. This test typically takes 30-45 minutes to complete.  If you have any questions regarding your exam or if you need to reschedule, you may call the CT department at [phone] between the hours of 8:00 am and 5:00 pm, Monday-Friday.  ________________________________________________________________________ We have sent the following medications to your pharmacy for you to pick up at your convenience: Protonix 40 mg daily. Pepcid 40 mg at bedtime.  Thank you,  Dr. Jackquline Denmark

## 2019-09-18 NOTE — Progress Notes (Signed)
Chief Complaint:   Referring Provider:  Elenore Paddy, NP      ASSESSMENT AND PLAN;   #1. GERD with eso dysphagia and epi pain. H/O dysphagia d/t distal esophageal stricture s/p dil to 16.5 mm TTS balloon, small epiphrenic diverticulum, small HH 07/2016.  #2. LLQ pain with bloating. Alt diarrhea and consti[pation  #3. Crohn's disease with distal TI stricture. Dx 07/2016 on colon. UGI with SB series 08/2016 with moderate to severe narrowing of TI.  Asymptomatic. Declined Biologics or any treatment.  Has been seen by Dr. Noberto Retort who also recommends watchful waiting.  #4.  H/O colonic polyps 07/2016 (Bx- TA)  Plan: -Protonix 40mg  po qd to continue. -Add Pepcid 40mg  po qhs -CTE for further evaluation -CBC, CMP, CRP -Stool for GI pathogen and calprotectin -EGD with balloon dil/colon with MiraLAX prep.   HPI:    Amanda Cobb is a 69 y.o. female  Known to me from [facility] practice  - C/O dysphagia mostly to solids over the last 8 to 10 months, lower chest area, mostly with meats.  About 6 months ago she was eating at a restaurant and had acute dysphagia with steak, had Heimlich's maneuver.  Denies having any significant heartburn except occasionally at night.  Has been compliant with Protonix.  -Has been having some left lower quadrant abdominal pain intermittently with associated abdominal bloating.  No nausea or vomiting.  Has alternating diarrhea and constipation.  More constipation.  Denies having any fever chills or night sweats.  No weight loss.  In fact she has gained some weight.  Past GI procedures: -EGD with dilatation 07/21/2016: Distal esophageal stricture s/p dilatation to 16.5 mm TTS balloon, small epiphrenic diverticulum, small HH, incidental duodenal diverticulum. -Colonoscopy 07/21/2016 ([facility]): 1 cm colonic polyp s/p polypectomy (Bx-TA), erosions and small ulcer in terminal ileum. Bx-consistent with Crohn's. -Upper GI with small bowel follow-through 08/11/2016: Moderate  to severe TI stricture. -Normal CBC, CMP, CRP at that time.  Positive IBD serology consistent with Crohn's.  Refused treatment.  Has quit smoking. Past Medical History:  Diagnosis Date  . Allergic rhinitis   . Anemia   . Anxiety   . COPD (chronic obstructive pulmonary disease) ([place])   . Crohn disease ([place])   . Esophageal dysphagia   . Family history of colonic polyps   . GERD (gastroesophageal reflux disease)   . Hiatal hernia   . History of colon polyps   . History of Helicobacter pylori infection   . Hyperlipidemia   . Pituitary microadenoma ([place])   . RLS (restless legs syndrome)     Past Surgical History:  Procedure Laterality Date  . COLONOSCOPY  07/21/2016   Colonic polup status post polypectomy. Few erosions and a small ulcer in the terminal ileum ? Importance- could represent Crohn's disease (biopsied)   . ESOPHAGOGASTRODUODENOSCOPY  07/21/2016   Distal esophageal stricture status post esophageal dilatation. Small epiphrenic diverticulum. Small hiatal hernia. Incidental duodenal diverticulum  . GALLBLADDER SURGERY    . TONSILLECTOMY    . TUBAL LIGATION      Family History  Problem Relation Age of Onset  . Dementia Mother   . Colon polyps Mother   . Asthma Father   . COPD Sister   . Depression Sister   . Depression Sister   . COPD Sister     Social History   Tobacco Use  . Smoking status: Former Smoker    Packs/day: 1.00    Years: 51.00    Pack years: 51.00  Types: Cigarettes    Start date: 69  . Smokeless tobacco: Never Used  . Tobacco comment: quit in 2017  Vaping Use  . Vaping Use: Never used  Substance Use Topics  . Alcohol use: Yes    Comment: Occasional, social  . Drug use: Never    Current Outpatient Medications  Medication Sig Dispense Refill  . albuterol (PROVENTIL HFA;VENTOLIN HFA) 108 (90 Base) MCG/ACT inhaler Inhale into the lungs every 6 (six) hours as needed for wheezing or shortness of breath.    Marland Kitchen alendronate (FOSAMAX) 70 MG  tablet Take 70 mg by mouth once a week.    . ALPRAZolam (XANAX) 0.5 MG tablet TAKE 1 & 1/2 TABLET BY MOUTH AT BEDTIME  0  . atorvastatin (LIPITOR) 20 MG tablet Take 20 mg by mouth every other day.  2  . benzonatate (TESSALON) 100 MG capsule Take 2 capsules (200 mg total) by mouth 3 (three) times daily as needed for cough. 60 capsule 1  . calcium carbonate (OS-CAL - DOSED IN MG OF ELEMENTAL CALCIUM) 1250 (500 Ca) MG tablet Take 1 tablet by mouth.    . dextromethorphan-guaiFENesin (MUCINEX DM) 30-600 MG 12hr tablet Take 1 tablet by mouth 2 (two) times daily as needed for cough.    . escitalopram (LEXAPRO) 10 MG tablet Take 10 mg by mouth daily.    . Fluticasone-Umeclidin-Vilant (TRELEGY ELLIPTA) 100-62.5-25 MCG/INH AEPB Inhale 1 puff into the lungs daily. 3 each 3  . ipratropium-albuterol (DUONEB) 0.5-2.5 (3) MG/3ML SOLN INHALE 3 MILLILITER EVERY 6 HOURS AS NEEDED FOR COPD EXACERBATION    . montelukast (SINGULAIR) 10 MG tablet Take 1 tablet (10 mg total) by mouth at bedtime. 30 tablet 6  . pantoprazole (PROTONIX) 40 MG tablet Take by mouth.    . fluticasone (FLONASE) 50 MCG/ACT nasal spray Place 1 spray into both nostrils 2 (two) times daily.    . ondansetron (ZOFRAN-ODT) 8 MG disintegrating tablet Take 8 mg by mouth every 8 (eight) hours as needed. (Patient not taking: Reported on 09/18/2019)     No current facility-administered medications for this visit.    Allergies  Allergen Reactions  . Codeine Nausea And Vomiting    Review of Systems:  Constitutional: Denies fever, chills, diaphoresis, appetite change and fatigue.  HEENT: Has multiple allergies.  Hard of hearing. Respiratory: Occ SOB, no DOE, has cough, no chest tightness,  and wheezing.   Cardiovascular: Denies chest pain, palpitations and leg swelling.  Genitourinary: Denies dysuria, urgency, frequency, hematuria, flank pain and difficulty urinating.  Musculoskeletal: Denies myalgias, Has back pain, No joint swelling, arthralgias  and gait problem.  Skin: No rash.  Neurological: Denies dizziness, seizures, syncope, weakness, light-headedness, numbness and headaches.  Hematological: Denies adenopathy. Easy bruising, personal or family bleeding history  Psychiatric/Behavioral: Has anxiety or depression     Physical Exam:    BP 118/70   Pulse 77   Ht 5\' 2"  (1.575 m)   Wt 105 lb 8 oz (47.9 kg)   BMI 19.30 kg/m  Wt Readings from Last 3 Encounters:  09/18/19 105 lb 8 oz (47.9 kg)  11/28/18 109 lb (49.4 kg)  11/16/18 102 lb (46.3 kg)   Constitutional:  Well-developed, in no acute distress. Psychiatric: Normal mood and affect. Behavior is normal. HEENT: Pupils normal.  Conjunctivae are normal. No scleral icterus. Neck supple.  Cardiovascular: Normal rate, regular rhythm. No edema Pulmonary/chest: Bilateral decreased breath sounds Abdominal: Soft, nondistended. Nontender. Bowel sounds active throughout. There are no masses palpable. No hepatomegaly. Rectal:  defered Neurological: Alert and oriented to person place and time. Skin: Skin is warm and dry. No rashes noted.  Data Reviewed: I have personally reviewed following labs and imaging studies  CBC: CBC Latest Ref Rng & Units 11/16/2018  WBC 4.0 - 10.5 K/uL 9.1  Hemoglobin 12.0 - 15.0 g/dL 12.8  Hematocrit 36 - 46 % 39.5  Platelets 150 - 400 K/uL 411(H)    CMP: CMP Latest Ref Rng & Units 02/14/2019 11/16/2018  Glucose 70 - 99 mg/dL 106(H) 119(H)  BUN 6 - 23 mg/dL 15 5(L)  Creatinine 0.40 - 1.20 mg/dL 0.58 0.63  Sodium 135 - 145 mEq/L 139 139  Potassium 3.5 - 5.1 mEq/L 3.1(L) 3.3(L)  Chloride 96 - 112 mEq/L 103 100  CO2 19 - 32 mEq/L 29 27  Calcium 8.4 - 10.5 mg/dL 9.0 9.5  Total Protein 6.5 - 8.1 g/dL - 7.3  Total Bilirubin 0.3 - 1.2 mg/dL - 0.4  Alkaline Phos 38 - 126 U/L - 76  AST 15 - 41 U/L - 23  ALT 0 - 44 U/L - 19      Carmell Austria, MD 09/18/2019, 10:39 AM  Cc: Elenore Paddy, NP

## 2019-09-19 ENCOUNTER — Other Ambulatory Visit: Payer: Medicare HMO

## 2019-09-19 DIAGNOSIS — R1310 Dysphagia, unspecified: Secondary | ICD-10-CM | POA: Diagnosis not present

## 2019-09-19 DIAGNOSIS — Z01818 Encounter for other preprocedural examination: Secondary | ICD-10-CM

## 2019-09-19 DIAGNOSIS — K219 Gastro-esophageal reflux disease without esophagitis: Secondary | ICD-10-CM

## 2019-09-22 LAB — GI PROFILE, STOOL, PCR

## 2019-09-24 ENCOUNTER — Other Ambulatory Visit: Payer: Self-pay

## 2019-09-24 ENCOUNTER — Ambulatory Visit (HOSPITAL_COMMUNITY)
Admission: RE | Admit: 2019-09-24 | Discharge: 2019-09-24 | Disposition: A | Discharge status: Home / Self Care | Payer: Medicare HMO | Source: Ambulatory Visit | Attending: Gastroenterology | Admitting: Gastroenterology

## 2019-09-24 ENCOUNTER — Encounter (HOSPITAL_COMMUNITY): Payer: Self-pay

## 2019-09-24 DIAGNOSIS — I700 Atherosclerosis of aorta: Secondary | ICD-10-CM | POA: Diagnosis not present

## 2019-09-24 DIAGNOSIS — N289 Disorder of kidney and ureter, unspecified: Secondary | ICD-10-CM | POA: Diagnosis not present

## 2019-09-24 DIAGNOSIS — K529 Noninfective gastroenteritis and colitis, unspecified: Secondary | ICD-10-CM | POA: Diagnosis not present

## 2019-09-24 DIAGNOSIS — K469 Unspecified abdominal hernia without obstruction or gangrene: Secondary | ICD-10-CM | POA: Diagnosis not present

## 2019-09-24 DIAGNOSIS — R1032 Left lower quadrant pain: Secondary | ICD-10-CM

## 2019-09-24 MED ORDER — SODIUM CHLORIDE (PF) 0.9 % IJ SOLN
INTRAMUSCULAR | Status: AC
  Filled 2019-09-24: qty 50

## 2019-09-24 MED ORDER — BARIUM SULFATE 0.1 % PO SUSP
ORAL | Status: AC
  Filled 2019-09-24: qty 3

## 2019-09-24 MED ORDER — IOHEXOL 300 MG/ML  SOLN
100.0000 mL | Freq: Once | INTRAMUSCULAR | Status: AC | PRN
  Administered 2019-09-24: 100 mL via INTRAVENOUS

## 2019-09-25 ENCOUNTER — Encounter: Payer: Self-pay | Admitting: Gastroenterology

## 2019-09-25 LAB — CALPROTECTIN: Calprotectin: 1610 ug/g — ABNORMAL HIGH

## 2019-09-29 NOTE — Progress Notes (Signed)
Please inform the patient. CTE-mild progression of Crohn's disease without any fistulas.  She does have a stricture as before.  No obvious obstruction at this point. C-reactive protein was normal Calprotectin was elevated Plan is to proceed with EGD/colonoscopy as previous note. Send report to family physician

## 2019-10-03 ENCOUNTER — Encounter: Payer: Self-pay | Admitting: Gastroenterology

## 2019-10-04 ENCOUNTER — Telehealth: Payer: Self-pay | Admitting: Gastroenterology

## 2019-10-07 ENCOUNTER — Other Ambulatory Visit: Payer: Self-pay | Admitting: Gastroenterology

## 2019-10-07 DIAGNOSIS — K219 Gastro-esophageal reflux disease without esophagitis: Secondary | ICD-10-CM

## 2019-10-07 NOTE — Telephone Encounter (Signed)
LMOM for patient to call back.

## 2019-10-07 NOTE — Telephone Encounter (Signed)
Spoke to patient who called to cancell her colonoscopy part of her procedure. She will proceed with the EGD. Dr Lyndel Safe notified of patients decision.

## 2019-10-08 ENCOUNTER — Encounter: Payer: Self-pay | Admitting: Gastroenterology

## 2019-10-10 DIAGNOSIS — K219 Gastro-esophageal reflux disease without esophagitis: Secondary | ICD-10-CM | POA: Diagnosis not present

## 2019-10-10 DIAGNOSIS — Z0000 Encounter for general adult medical examination without abnormal findings: Secondary | ICD-10-CM | POA: Diagnosis not present

## 2019-10-10 DIAGNOSIS — K5090 Crohn's disease, unspecified, without complications: Secondary | ICD-10-CM | POA: Diagnosis not present

## 2019-10-10 DIAGNOSIS — E782 Mixed hyperlipidemia: Secondary | ICD-10-CM | POA: Diagnosis not present

## 2019-10-10 DIAGNOSIS — R69 Illness, unspecified: Secondary | ICD-10-CM | POA: Diagnosis not present

## 2019-10-10 DIAGNOSIS — M810 Age-related osteoporosis without current pathological fracture: Secondary | ICD-10-CM | POA: Diagnosis not present

## 2019-10-10 DIAGNOSIS — I700 Atherosclerosis of aorta: Secondary | ICD-10-CM | POA: Diagnosis not present

## 2019-10-10 DIAGNOSIS — I70219 Atherosclerosis of native arteries of extremities with intermittent claudication, unspecified extremity: Secondary | ICD-10-CM | POA: Diagnosis not present

## 2019-10-10 DIAGNOSIS — J449 Chronic obstructive pulmonary disease, unspecified: Secondary | ICD-10-CM | POA: Diagnosis not present

## 2019-10-15 ENCOUNTER — Other Ambulatory Visit: Payer: Self-pay | Admitting: Gastroenterology

## 2019-10-15 ENCOUNTER — Ambulatory Visit (INDEPENDENT_AMBULATORY_CARE_PROVIDER_SITE_OTHER): Payer: Medicare HMO

## 2019-10-15 DIAGNOSIS — Z1159 Encounter for screening for other viral diseases: Secondary | ICD-10-CM

## 2019-10-15 LAB — SARS CORONAVIRUS 2 (TAT 6-24 HRS): SARS Coronavirus 2: NEGATIVE

## 2019-10-16 ENCOUNTER — Encounter: Payer: Self-pay | Admitting: Certified Registered Nurse Anesthetist

## 2019-10-17 ENCOUNTER — Encounter: Payer: Self-pay | Admitting: Gastroenterology

## 2019-10-17 ENCOUNTER — Ambulatory Visit (AMBULATORY_SURGERY_CENTER): Payer: Medicare HMO | Admitting: Gastroenterology

## 2019-10-17 ENCOUNTER — Telehealth: Payer: Self-pay | Admitting: Primary Care

## 2019-10-17 ENCOUNTER — Other Ambulatory Visit: Payer: Self-pay

## 2019-10-17 ENCOUNTER — Encounter: Payer: Medicare HMO | Admitting: Gastroenterology

## 2019-10-17 VITALS — BP 130/84 | HR 86 | Temp 98.1°F | Resp 23 | Ht 62.0 in | Wt 105.0 lb

## 2019-10-17 DIAGNOSIS — R1310 Dysphagia, unspecified: Secondary | ICD-10-CM

## 2019-10-17 DIAGNOSIS — K319 Disease of stomach and duodenum, unspecified: Secondary | ICD-10-CM | POA: Diagnosis not present

## 2019-10-17 DIAGNOSIS — K2970 Gastritis, unspecified, without bleeding: Secondary | ICD-10-CM | POA: Diagnosis not present

## 2019-10-17 DIAGNOSIS — K222 Esophageal obstruction: Secondary | ICD-10-CM

## 2019-10-17 DIAGNOSIS — K225 Diverticulum of esophagus, acquired: Secondary | ICD-10-CM

## 2019-10-17 DIAGNOSIS — K219 Gastro-esophageal reflux disease without esophagitis: Secondary | ICD-10-CM

## 2019-10-17 DIAGNOSIS — K2950 Unspecified chronic gastritis without bleeding: Secondary | ICD-10-CM | POA: Diagnosis not present

## 2019-10-17 DIAGNOSIS — J449 Chronic obstructive pulmonary disease, unspecified: Secondary | ICD-10-CM | POA: Diagnosis not present

## 2019-10-17 MED ORDER — SODIUM CHLORIDE 0.9 % IV SOLN: 500.0000 mL | Freq: Once | INTRAVENOUS | Status: DC

## 2019-10-17 NOTE — Progress Notes (Signed)
Called to room to assist during endoscopic procedure.  Patient ID and intended procedure confirmed with present staff. Received instructions for my participation in the procedure from the performing physician.  

## 2019-10-17 NOTE — Patient Instructions (Signed)
Handouts given for Gastritis, Hiatal Hernia, Esophageal stricture and Post Dilation Diet.  YOU HAD AN ENDOSCOPIC PROCEDURE TODAY AT [facility]:   Refer to the procedure report that was given to you for any specific questions about what was found during the examination.  If the procedure report does not answer your questions, please call your gastroenterologist to clarify.  If you requested that your care partner not be given the details of your procedure findings, then the procedure report has been included in a sealed envelope for you to review at your convenience later.  YOU SHOULD EXPECT: Some feelings of bloating in the abdomen. Passage of more gas than usual.  Walking can help get rid of the air that was put into your GI tract during the procedure and reduce the bloating. If you had a lower endoscopy (such as a colonoscopy or flexible sigmoidoscopy) you may notice spotting of blood in your stool or on the toilet paper. If you underwent a bowel prep for your procedure, you may not have a normal bowel movement for a few days.  Please Note:  You might notice some irritation and congestion in your nose or some drainage.  This is from the oxygen used during your procedure.  There is no need for concern and it should clear up in a day or so.  SYMPTOMS TO REPORT IMMEDIATELY:   Following upper endoscopy (EGD)  Vomiting of blood or coffee ground material  New chest pain or pain under the shoulder blades  Painful or persistently difficult swallowing  New shortness of breath  Fever of 100F or higher  Black, tarry-looking stools  For urgent or emergent issues, a gastroenterologist can be reached at any hour by calling [phone]. Do not use MyChart messaging for urgent concerns.    DIET:  SEE POST DILATION HANDOUT! NOTHING BY MOUTH FOR ONE HOUR(UNTIL 4 PM) CLEAR LIQUIDS UNTIL 5 PM, THEN SOFT DIET FOR THE REST OF TODAY.  We do recommend a small meal at first, but then you may  proceed to your regular diet.  Drink plenty of fluids but you should avoid alcoholic beverages for 24 hours.  ACTIVITY:  You should plan to take it easy for the rest of today and you should NOT DRIVE or use heavy machinery until tomorrow (because of the sedation medicines used during the test).    FOLLOW UP: Our staff will call the number listed on your records 48-72 hours following your procedure to check on you and address any questions or concerns that you may have regarding the information given to you following your procedure. If we do not reach you, we will leave a message.  We will attempt to reach you two times.  During this call, we will ask if you have developed any symptoms of COVID 19. If you develop any symptoms (ie: fever, flu-like symptoms, shortness of breath, cough etc.) before then, please call [phone].  If you test positive for Covid 19 in the 2 weeks post procedure, please call and report this information to [place].    If any biopsies were taken you will be contacted by phone or by letter within the next 1-3 weeks.  Please call us at [phone] if you have not heard about the biopsies in 3 weeks.    SIGNATURES/CONFIDENTIALITY: You and/or your care partner have signed paperwork which will be entered into your electronic medical record.  These signatures attest to the fact that that the information above on your  After Visit Summary has been reviewed and is understood.  Full responsibility of the confidentiality of this discharge information lies with you and/or your care-partner.

## 2019-10-17 NOTE — Progress Notes (Signed)
1439 Robinul 0.1 mg IV given due large amount of secretions upon assessment.  MD made aware, vss 

## 2019-10-17 NOTE — Progress Notes (Signed)
Pt's states no medical or surgical changes since previsit or office visit.  CW - vitals 

## 2019-10-17 NOTE — Op Note (Signed)
[facility] Patient Name: Taniya Dasher Procedure Date: 10/17/2019 2:38 PM MRN: [id number] Endoscopist: Jackquline Denmark , MD Age: 69 Referring MD:  Date of Birth: 1950/02/19 Gender: Female Account #: [id number] Procedure:                Upper GI endoscopy Indications:              Dysphagia Medicines:                Monitored Anesthesia Care Procedure:                Pre-Anesthesia Assessment:                           - Prior to the procedure, a History and Physical                            was performed, and patient medications and                            allergies were reviewed. The patient's tolerance of                            previous anesthesia was also reviewed. The risks                            and benefits of the procedure and the sedation                            options and risks were discussed with the patient.                            All questions were answered, and informed consent                            was obtained. Prior Anticoagulants: The patient has                            taken no previous anticoagulant or antiplatelet                            agents. ASA Grade Assessment: III - A patient with                            severe systemic disease. After reviewing the risks                            and benefits, the patient was deemed in                            satisfactory condition to undergo the procedure.                           After obtaining informed consent, the endoscope was  passed under direct vision. Throughout the                            procedure, the patient's blood pressure, pulse, and                            oxygen saturations were monitored continuously. The                            Endoscope was introduced through the mouth, and                            advanced to the second part of duodenum. The upper                            GI endoscopy was accomplished without  difficulty.                            The patient tolerated the procedure well. Scope In: Scope Out: Findings:                 The examined esophagus was mildly tortuous. A small                            epiphernic 30mm diverticulum was noted just proximal                            to the stricture.                           One benign-appearing, intrinsic mild stenosis was                            found 38 cm from the incisors. This stenosis                            measured 1.2 cm (inner diameter). The stenosis was                            traversed. A TTS dilator was passed through the                            scope. Dilation with a 16-17-18 mm balloon dilator                            was performed to 18 mm. The dilation site was                            examined and showed moderate improvement in luminal                            narrowing.                           A  2 cm hiatal hernia was present.                           Localized mild inflammation characterized by                            erythema was found in the gastric body and in the                            gastric antrum. Biopsies were taken with a cold                            forceps for histology.                           A 10 mm non-bleeding diverticulum was found in the                            second portion of the duodenum. Complications:            No immediate complications. Estimated Blood Loss:     Estimated blood loss: none. Impression:               - Mild Presbyesophagus.                           - Benign-appearing esophageal stenosis. Dilated.                           - 2 cm hiatal hernia.                           - Gastritis. Biopsied.                           - Non-bleeding incidental duodenal diverticulum. Recommendation:           - Patient has a contact number available for                            emergencies. The signs and symptoms of potential                             delayed complications were discussed with the                            patient. Return to normal activities tomorrow.                            Written discharge instructions were provided to the                            patient.                           - Post dil diet.                           -  Continue present medications.                           - Await pathology results.                           - The findings and recommendations were discussed                            with the patient's family Clair Gulling).                           - She didn't want colonoscopy. Jackquline Denmark, MD 10/17/2019 3:00:55 PM This report has been signed electronically.

## 2019-10-17 NOTE — Telephone Encounter (Signed)
ATC patient was unable to leave a voicemail mail box is full .

## 2019-10-17 NOTE — Progress Notes (Signed)
Report given to PACU, vss 

## 2019-10-18 DIAGNOSIS — Z20828 Contact with and (suspected) exposure to other viral communicable diseases: Secondary | ICD-10-CM | POA: Diagnosis not present

## 2019-10-19 NOTE — Telephone Encounter (Signed)
1 sample available  Spoke with the pt  She states that she does not need it any more  Will close out encounter

## 2019-10-21 ENCOUNTER — Telehealth: Payer: Self-pay

## 2019-10-21 NOTE — Telephone Encounter (Signed)
  Follow up Call-  Call back number 10/17/2019  Post procedure Call Back phone  # [phone]  Permission to leave phone message Yes     Patient questions:  Do you have a fever, pain , or abdominal swelling? No. Pain Score  0 *  Have you tolerated food without any problems? Yes.    Have you been able to return to your normal activities? Yes.    Do you have any questions about your discharge instructions: Diet   No. Medications  No. Follow up visit  No.  Do you have questions or concerns about your Care? No.  Actions: * If pain score is 4 or above: No action needed, pain <4.    1. Have you developed a fever since your procedure? No   2.   Have you had an respiratory symptoms (SOB or cough) since your procedure? No   3.   Have you tested positive for COVID 19 since your procedure? No   4.   Have you had any family members/close contacts diagnosed with the COVID 19 since your procedure?  No    If yes to any of these questions please route to Joylene John, RN and Joella Prince, RN

## 2019-10-24 ENCOUNTER — Encounter: Payer: Self-pay | Admitting: Gastroenterology

## 2019-10-28 DIAGNOSIS — J019 Acute sinusitis, unspecified: Secondary | ICD-10-CM | POA: Diagnosis not present

## 2019-10-28 DIAGNOSIS — J441 Chronic obstructive pulmonary disease with (acute) exacerbation: Secondary | ICD-10-CM | POA: Diagnosis not present

## 2019-11-08 DIAGNOSIS — R69 Illness, unspecified: Secondary | ICD-10-CM | POA: Diagnosis not present

## 2019-11-11 DIAGNOSIS — R69 Illness, unspecified: Secondary | ICD-10-CM | POA: Diagnosis not present

## 2019-11-14 ENCOUNTER — Other Ambulatory Visit: Payer: Self-pay

## 2019-11-14 ENCOUNTER — Ambulatory Visit (INDEPENDENT_AMBULATORY_CARE_PROVIDER_SITE_OTHER)
Admission: RE | Admit: 2019-11-14 | Discharge: 2019-11-14 | Disposition: A | Discharge status: Home / Self Care | Payer: Medicare HMO | Source: Ambulatory Visit | Attending: Acute Care | Admitting: Acute Care

## 2019-11-14 DIAGNOSIS — Z87891 Personal history of nicotine dependence: Secondary | ICD-10-CM

## 2019-11-14 DIAGNOSIS — I700 Atherosclerosis of aorta: Secondary | ICD-10-CM | POA: Diagnosis not present

## 2019-11-14 DIAGNOSIS — S2241XA Multiple fractures of ribs, right side, initial encounter for closed fracture: Secondary | ICD-10-CM | POA: Diagnosis not present

## 2019-11-14 DIAGNOSIS — I2510 Atherosclerotic heart disease of native coronary artery without angina pectoris: Secondary | ICD-10-CM | POA: Diagnosis not present

## 2019-11-14 DIAGNOSIS — J439 Emphysema, unspecified: Secondary | ICD-10-CM | POA: Diagnosis not present

## 2019-11-21 DIAGNOSIS — J329 Chronic sinusitis, unspecified: Secondary | ICD-10-CM | POA: Diagnosis not present

## 2019-11-21 DIAGNOSIS — J449 Chronic obstructive pulmonary disease, unspecified: Secondary | ICD-10-CM | POA: Diagnosis not present

## 2019-11-21 DIAGNOSIS — J40 Bronchitis, not specified as acute or chronic: Secondary | ICD-10-CM | POA: Diagnosis not present

## 2019-11-21 DIAGNOSIS — Z681 Body mass index (BMI) 19 or less, adult: Secondary | ICD-10-CM | POA: Diagnosis not present

## 2019-11-21 NOTE — Progress Notes (Signed)
I have called the patient with the results of their low dose CT. I explained that her scan was read as a Lung  RADS 3, nodules that are probably benign findings, short term follow up suggested: includes nodules with a low likelihood of becoming a clinically active cancer. Radiology recommends a 6 month repeat LDCT follow up. I also explained that it looked like she had areas of mucoid impaction. I asked if she has been sick, and she said that she has. She is using mucinex, but she has green secretions at present. She has an appointment with her PCP today.  I explained we will do a 6 month follow up CT to ensure this area clears with treatment. She verbalized understanding and is in agreement with this plan.   Langley Gauss, please place order for 6 month follow up, and fax results to PCP.Thanks so much

## 2019-11-24 ENCOUNTER — Other Ambulatory Visit: Payer: Self-pay | Admitting: Primary Care

## 2019-11-25 ENCOUNTER — Other Ambulatory Visit: Payer: Self-pay | Admitting: *Deleted

## 2019-11-25 DIAGNOSIS — Z87891 Personal history of nicotine dependence: Secondary | ICD-10-CM

## 2019-11-27 DIAGNOSIS — R69 Illness, unspecified: Secondary | ICD-10-CM | POA: Diagnosis not present

## 2019-12-05 ENCOUNTER — Telehealth: Payer: Self-pay | Admitting: Primary Care

## 2019-12-05 MED ORDER — TRELEGY ELLIPTA 100-62.5-25 MCG/INH IN AEPB: 1.0000 | INHALATION_SPRAY | Freq: Every day | RESPIRATORY_TRACT | 0 refills | Status: AC

## 2019-12-05 NOTE — Telephone Encounter (Signed)
Spoke with pt. She is in need of samples of Trelegy. These have been left up front for pick up. Nothing further was needed.

## 2019-12-09 ENCOUNTER — Telehealth: Payer: Self-pay | Admitting: Primary Care

## 2019-12-09 NOTE — Telephone Encounter (Signed)
Please schedule for televisit with me on Wednesday or APP tomorrow if needs to be done sooner.  She needs to be covid tested  [facility] Nash, DO [facility] 12/09/2019 5:53 PM

## 2019-12-09 NOTE — Telephone Encounter (Signed)
Spoke with the pt  She is c/o increased SOB, chest tightness and wheezing x 3 wks  She states her cough is about the same, but she has noticed her sputum is light yellow recently She denies any f/c/s, body aches  She is still taking her trelegy, singulair and using her albuterol neb about 3 x per day  She has NOT been vaccinated against covid  Please advise, thanks

## 2019-12-10 NOTE — Telephone Encounter (Signed)
Spoke with the pt and advised of the recs per Dr Valeta Harms  She disagrees and does not want covid testing done  She states she only wants something called in and does not want visit  She states will call her PCP for phone medicine

## 2020-01-09 DIAGNOSIS — J441 Chronic obstructive pulmonary disease with (acute) exacerbation: Secondary | ICD-10-CM | POA: Diagnosis not present

## 2020-01-09 DIAGNOSIS — J432 Centrilobular emphysema: Secondary | ICD-10-CM | POA: Diagnosis not present

## 2020-01-09 DIAGNOSIS — J449 Chronic obstructive pulmonary disease, unspecified: Secondary | ICD-10-CM | POA: Diagnosis not present

## 2020-01-09 DIAGNOSIS — K219 Gastro-esophageal reflux disease without esophagitis: Secondary | ICD-10-CM | POA: Diagnosis not present

## 2020-01-09 DIAGNOSIS — Z681 Body mass index (BMI) 19 or less, adult: Secondary | ICD-10-CM | POA: Diagnosis not present

## 2020-02-11 DIAGNOSIS — Z681 Body mass index (BMI) 19 or less, adult: Secondary | ICD-10-CM | POA: Diagnosis not present

## 2020-02-11 DIAGNOSIS — J449 Chronic obstructive pulmonary disease, unspecified: Secondary | ICD-10-CM | POA: Diagnosis not present

## 2020-02-11 DIAGNOSIS — R300 Dysuria: Secondary | ICD-10-CM | POA: Diagnosis not present

## 2020-02-13 DIAGNOSIS — L300 Nummular dermatitis: Secondary | ICD-10-CM | POA: Diagnosis not present

## 2020-02-13 DIAGNOSIS — D225 Melanocytic nevi of trunk: Secondary | ICD-10-CM | POA: Diagnosis not present

## 2020-02-13 DIAGNOSIS — L299 Pruritus, unspecified: Secondary | ICD-10-CM | POA: Diagnosis not present

## 2020-02-18 ENCOUNTER — Other Ambulatory Visit: Payer: Self-pay

## 2020-02-18 ENCOUNTER — Encounter: Payer: Self-pay | Admitting: Pulmonary Disease

## 2020-02-18 ENCOUNTER — Ambulatory Visit (INDEPENDENT_AMBULATORY_CARE_PROVIDER_SITE_OTHER): Payer: Medicare HMO | Admitting: Pulmonary Disease

## 2020-02-18 VITALS — BP 124/64 | HR 85 | Temp 97.1°F | Ht 63.0 in | Wt 103.0 lb

## 2020-02-18 DIAGNOSIS — J9611 Chronic respiratory failure with hypoxia: Secondary | ICD-10-CM | POA: Diagnosis not present

## 2020-02-18 DIAGNOSIS — J449 Chronic obstructive pulmonary disease, unspecified: Secondary | ICD-10-CM | POA: Diagnosis not present

## 2020-02-18 DIAGNOSIS — R911 Solitary pulmonary nodule: Secondary | ICD-10-CM

## 2020-02-18 DIAGNOSIS — J432 Centrilobular emphysema: Secondary | ICD-10-CM | POA: Diagnosis not present

## 2020-02-18 DIAGNOSIS — Z87891 Personal history of nicotine dependence: Secondary | ICD-10-CM

## 2020-02-18 MED ORDER — AZITHROMYCIN 250 MG PO TABS: 250.0000 mg | ORAL_TABLET | ORAL | 1 refills | Status: AC

## 2020-02-18 MED ORDER — TRELEGY ELLIPTA 100-62.5-25 MCG/INH IN AEPB
1.0000 | INHALATION_SPRAY | Freq: Every day | RESPIRATORY_TRACT | 3 refills | Status: DC
Start: 2020-02-18 — End: 2020-09-17

## 2020-02-18 MED ORDER — ALBUTEROL SULFATE HFA 108 (90 BASE) MCG/ACT IN AERS
1.0000 | INHALATION_SPRAY | Freq: Four times a day (QID) | RESPIRATORY_TRACT | 11 refills | Status: DC | PRN
Start: 2020-02-18 — End: 2022-09-09

## 2020-02-18 NOTE — Progress Notes (Signed)
Synopsis: Referred in Novermber 2019 for COPD.  Subjective:   PATIENT ID: Amanda Cobb GENDER: female DOB: November 03, 1950, MRN: [id number]  Chief Complaint  Patient presents with  . Follow-up    She is having coughing spells and they are happening everyday and these drain her.  She is also having to do nebs BID and still using her rescue inhaler.     OV 12/2017: PMH of GERD and COPD. She was diagnosed with COPD by office spiro about 6 years ago. Quit smoking 3 years ago, smoked from age 30 to 34, about 1 ppd for 50 years.  Overall has been doing well but does notice cough on occasion.  She does have some shortness of breath.  Her cough is productive.  She denies fevers denies hemoptysis.  Her cough at times is strong enough to make her vomit.  She does have significant coughing spells worse in the mornings.  She is currently managed using a Trelegy inhaler.  She is not really sure that this made a whole lot of difference.  She used to be on Symbicort (she believes) in the past and she thought that this helped.  OV 12/20/2018: Virtual Visit via Telephone Note Patient with COPD.  Increased cough sputum production shortness of breath and wheezing at home. Observations/Objective: Patient able to speak in complete sentences. Assessment and Plan: Acute exacerbation of COPD -Start prednisone taper -Z-Pak -Refill Trelegy  OV 02/18/2020: Here today for follow-up regarding COPD.  Having ongoing bouts of cough and shortness of breath.  Daily sputum production.  Recent exacerbation requiring antibiotics.  She also felt to have a urinary tract infection.  Doing better at this time but still has cough and dyspnea on exertion.  She tries to walk on her treadmill but has had difficulty with this as well.  Taking her Trelegy regularly.  Patient had new lung nodule on recent CT imaging in October.  Follow-up scheduled for 6 months in April 2022.     Past Medical History:  Diagnosis Date  . Allergic  rhinitis   . Anemia   . Anxiety   . COPD (chronic obstructive pulmonary disease) ([place])   . Crohn disease ([place])   . Esophageal dysphagia   . Family history of colonic polyps   . GERD (gastroesophageal reflux disease)   . Hiatal hernia   . History of colon polyps   . History of Helicobacter pylori infection   . Hyperlipidemia   . Pituitary microadenoma ([place])   . RLS (restless legs syndrome)      Family History  Problem Relation Age of Onset  . Dementia Mother   . Colon polyps Mother   . Asthma Father   . COPD Sister   . Depression Sister   . Depression Sister   . COPD Sister   . Colon cancer Neg Hx   . Esophageal cancer Neg Hx   . Rectal cancer Neg Hx   . Stomach cancer Neg Hx       Past Surgical History:  Procedure Laterality Date  . COLONOSCOPY  07/21/2016   Colonic polup status post polypectomy. Few erosions and a small ulcer in the terminal ileum ? Importance- could represent Crohn's disease (biopsied)   . ESOPHAGOGASTRODUODENOSCOPY  07/21/2016   Distal esophageal stricture status post esophageal dilatation. Small epiphrenic diverticulum. Small hiatal hernia. Incidental duodenal diverticulum  . GALLBLADDER SURGERY    . TONSILLECTOMY    . TUBAL LIGATION      Social History   Socioeconomic History  .  Marital status: Married    Spouse name: Not on file  . Number of children: Not on file  . Years of education: Not on file  . Highest education level: Not on file  Occupational History  . Not on file  Tobacco Use  . Smoking status: Former Smoker    Packs/day: 1.00    Years: 51.00    Pack years: 51.00    Types: Cigarettes    Start date: 1965  . Smokeless tobacco: Never Used  . Tobacco comment: quit in 2017  Vaping Use  . Vaping Use: Never used  Substance and Sexual Activity  . Alcohol use: Yes    Comment: Occasional, social  . Drug use: Never  . Sexual activity: Not on file  Other Topics Concern  . Not on file  Social History Narrative  . Not on file    Social Determinants of Health   Financial Resource Strain: Not on file  Food Insecurity: Not on file  Transportation Needs: Not on file  Physical Activity: Not on file  Stress: Not on file  Social Connections: Not on file  Intimate Partner Violence: Not on file     Allergies  Allergen Reactions  . Codeine Nausea And Vomiting     Outpatient Medications Prior to Visit  Medication Sig Dispense Refill  . albuterol (PROVENTIL HFA;VENTOLIN HFA) 108 (90 Base) MCG/ACT inhaler Inhale into the lungs every 6 (six) hours as needed for wheezing or shortness of breath.    Marland Kitchen alendronate (FOSAMAX) 70 MG tablet Take 70 mg by mouth once a week.    . ALPRAZolam (XANAX) 0.5 MG tablet TAKE 1 & 1/2 TABLET BY MOUTH AT BEDTIME  0  . atorvastatin (LIPITOR) 20 MG tablet Take 20 mg by mouth every other day.  2  . benzonatate (TESSALON) 100 MG capsule Take 2 capsules (200 mg total) by mouth 3 (three) times daily as needed for cough. 60 capsule 1  . calcium carbonate (OS-CAL - DOSED IN MG OF ELEMENTAL CALCIUM) 1250 (500 Ca) MG tablet Take 1 tablet by mouth.    . dextromethorphan-guaiFENesin (MUCINEX DM) 30-600 MG 12hr tablet Take 1 tablet by mouth 2 (two) times daily as needed for cough.    . escitalopram (LEXAPRO) 10 MG tablet Take 10 mg by mouth daily.    . famotidine (PEPCID) 40 MG tablet Take 1 tablet (40 mg total) by mouth at bedtime. 30 tablet 3  . fluticasone (FLONASE) 50 MCG/ACT nasal spray Place 1 spray into both nostrils 2 (two) times daily.    . Fluticasone-Umeclidin-Vilant (TRELEGY ELLIPTA) 100-62.5-25 MCG/INH AEPB Inhale 1 puff into the lungs daily. 3 each 3  . ipratropium-albuterol (DUONEB) 0.5-2.5 (3) MG/3ML SOLN INHALE 3 MILLILITER EVERY 6 HOURS AS NEEDED FOR COPD EXACERBATION    . montelukast (SINGULAIR) 10 MG tablet TAKE 1 TABLET BY MOUTH EVERYDAY AT BEDTIME 90 tablet 0  . ondansetron (ZOFRAN-ODT) 8 MG disintegrating tablet Take 8 mg by mouth every 8 (eight) hours as needed.    .  pantoprazole (PROTONIX) 40 MG tablet Take 1 tablet (40 mg total) by mouth daily. 90 tablet 3  . pantoprazole (PROTONIX) 40 MG tablet Take 1 tablet (40 mg total) by mouth daily. (Patient not taking: Reported on 02/18/2020) 30 tablet 3   No facility-administered medications prior to visit.    Review of Systems  Constitutional: Negative for chills, fever, malaise/fatigue and weight loss.  HENT: Negative for hearing loss, sore throat and tinnitus.   Eyes: Negative for blurred vision  and double vision.  Respiratory: Positive for cough, sputum production and shortness of breath. Negative for hemoptysis, wheezing and stridor.   Cardiovascular: Negative for chest pain, palpitations, orthopnea, leg swelling and PND.  Gastrointestinal: Negative for abdominal pain, constipation, diarrhea, heartburn, nausea and vomiting.  Genitourinary: Negative for dysuria, hematuria and urgency.  Musculoskeletal: Negative for joint pain and myalgias.  Skin: Negative for itching and rash.  Neurological: Negative for dizziness, tingling, weakness and headaches.  Endo/Heme/Allergies: Negative for environmental allergies. Does not bruise/bleed easily.  Psychiatric/Behavioral: Negative for depression. The patient is not nervous/anxious and does not have insomnia.   All other systems reviewed and are negative.    Objective:  Physical Exam Vitals reviewed.  Constitutional:      General: She is not in acute distress.    Appearance: She is well-developed.  HENT:     Head: Normocephalic and atraumatic.     Mouth/Throat:     Mouth: Oropharynx is clear and moist.     Pharynx: No oropharyngeal exudate.  Eyes:     Extraocular Movements: EOM normal.     Conjunctiva/sclera: Conjunctivae normal.     Pupils: Pupils are equal, round, and reactive to light.  Neck:     Vascular: No JVD.     Trachea: No tracheal deviation.     Comments: Loss of supraclavicular fat Cardiovascular:     Rate and Rhythm: Normal rate and  regular rhythm.     Pulses: Intact distal pulses.     Heart sounds: S1 normal and S2 normal.     Comments: Distant heart tones Pulmonary:     Effort: No tachypnea or accessory muscle usage.     Breath sounds: No stridor. Decreased breath sounds (throughout all lung fields) present. No wheezing, rhonchi or rales.     Comments: Increased AP chest diameter Abdominal:     General: Bowel sounds are normal. There is no distension.     Palpations: Abdomen is soft.     Tenderness: There is no abdominal tenderness.  Musculoskeletal:        General: Deformity (muscle wasting ) present. No edema.  Skin:    General: Skin is warm and dry.     Capillary Refill: Capillary refill takes less than 2 seconds.     Findings: No rash.  Neurological:     Mental Status: She is alert and oriented to person, place, and time.  Psychiatric:        Mood and Affect: Mood and affect normal.        Behavior: Behavior normal.      Vitals:   02/18/20 0952  BP: 124/64  Pulse: 85  Temp: (!) 97.1 F (36.2 C)  TempSrc: Tympanic  SpO2: 95%  Weight: 103 lb (46.7 kg)  Height: 5\' 3"  (1.6 m)   95% on RA BMI Readings from Last 3 Encounters:  02/18/20 18.25 kg/m  10/17/19 19.20 kg/m  09/18/19 19.30 kg/m   Wt Readings from Last 3 Encounters:  02/18/20 103 lb (46.7 kg)  10/17/19 105 lb (47.6 kg)  09/18/19 105 lb 8 oz (47.9 kg)     CBC    Component Value Date/Time   WBC 7.0 09/18/2019 1208   RBC 4.23 09/18/2019 1208   HGB 12.2 09/18/2019 1208   HCT 36.2 09/18/2019 1208   PLT 328.0 09/18/2019 1208   MCV 85.7 09/18/2019 1208   MCH 28.8 11/16/2018 0844   MCHC 33.6 09/18/2019 1208   RDW 14.7 09/18/2019 1208   LYMPHSABS 1.4 09/18/2019 1208  MONOABS 0.8 09/18/2019 1208   EOSABS 0.4 09/18/2019 1208   BASOSABS 0.1 09/18/2019 1208    Chest Imaging:  CT chest October 2021: New to left lower lobe pulmonary nodules 1 at 5.1 mm it was not present before recommending repeat follow-up in April  2022. The patient's images have been independently reviewed by me.    Pulmonary Functions Testing Results:  PFT Results Latest Ref Rng & Units 12/11/2018  FVC-Pre L 1.27  FVC-Predicted Pre % 46  FVC-Post L 1.52  FVC-Predicted Post % 56  Pre FEV1/FVC % % 38  Post FEV1/FCV % % 38  FEV1-Pre L 0.48  FEV1-Predicted Pre % 23  FEV1-Post L 0.58  DLCO uncorrected ml/min/mmHg 10.11  DLCO UNC% % 56  DLVA Predicted % 74  TLC L 4.86  TLC % Predicted % 104  RV % Predicted % 171    FeNO: None   Pathology: None   Echocardiogram: None   Heart Catheterization: None     Assessment & Plan:   Stage 4 very severe COPD by GOLD classification [facility])  Former smoker - Plan: EKG 12-Lead  Chronic hypoxemic respiratory failure (HCC)  Left lower lobe pulmonary nodule  Centrilobular emphysema (HCC)   Discussion: This is a 70 year old former smoker, severe COPD baseline, with advanced emphysema.  Now with ongoing cough sputum production.  Still feels significantly dyspneic on exertion.  Currently on triple therapy inhaler regimen.  Not vaccinated for COVID.  Plan: Discussed recommendations regarding addition of azithromycin Monday Wednesday Friday 250 mg. We talked about the risk benefits of starting this to include QT changes as well as small risk of hearing changes or loss. Patient willing to try the medication to see if it makes a difference in her breathing. We will obtain ECG today. Refills of Trelegy today Refills of albuterol inhaler today. Encouraged to take over-the-counter vitamin D supplementation.    Current Outpatient Medications:  .  albuterol (PROVENTIL HFA;VENTOLIN HFA) 108 (90 Base) MCG/ACT inhaler, Inhale into the lungs every 6 (six) hours as needed for wheezing or shortness of breath., Disp: , Rfl:  .  alendronate (FOSAMAX) 70 MG tablet, Take 70 mg by mouth once a week., Disp: , Rfl:  .  ALPRAZolam (XANAX) 0.5 MG tablet, TAKE 1 & 1/2 TABLET BY MOUTH AT BEDTIME, Disp: ,  Rfl: 0 .  atorvastatin (LIPITOR) 20 MG tablet, Take 20 mg by mouth every other day., Disp: , Rfl: 2 .  benzonatate (TESSALON) 100 MG capsule, Take 2 capsules (200 mg total) by mouth 3 (three) times daily as needed for cough., Disp: 60 capsule, Rfl: 1 .  calcium carbonate (OS-CAL - DOSED IN MG OF ELEMENTAL CALCIUM) 1250 (500 Ca) MG tablet, Take 1 tablet by mouth., Disp: , Rfl:  .  dextromethorphan-guaiFENesin (MUCINEX DM) 30-600 MG 12hr tablet, Take 1 tablet by mouth 2 (two) times daily as needed for cough., Disp: , Rfl:  .  escitalopram (LEXAPRO) 10 MG tablet, Take 10 mg by mouth daily., Disp: , Rfl:  .  famotidine (PEPCID) 40 MG tablet, Take 1 tablet (40 mg total) by mouth at bedtime., Disp: 30 tablet, Rfl: 3 .  fluticasone (FLONASE) 50 MCG/ACT nasal spray, Place 1 spray into both nostrils 2 (two) times daily., Disp: , Rfl:  .  Fluticasone-Umeclidin-Vilant (TRELEGY ELLIPTA) 100-62.5-25 MCG/INH AEPB, Inhale 1 puff into the lungs daily., Disp: 3 each, Rfl: 3 .  ipratropium-albuterol (DUONEB) 0.5-2.5 (3) MG/3ML SOLN, INHALE 3 MILLILITER EVERY 6 HOURS AS NEEDED FOR COPD EXACERBATION, Disp: ,  Rfl:  .  montelukast (SINGULAIR) 10 MG tablet, TAKE 1 TABLET BY MOUTH EVERYDAY AT BEDTIME, Disp: 90 tablet, Rfl: 0 .  ondansetron (ZOFRAN-ODT) 8 MG disintegrating tablet, Take 8 mg by mouth every 8 (eight) hours as needed., Disp: , Rfl:  .  pantoprazole (PROTONIX) 40 MG tablet, Take 1 tablet (40 mg total) by mouth daily., Disp: 90 tablet, Rfl: 3   Garner Nash, DO [facility] 02/18/2020 10:08 AM    PCCM:   QTC on ECG today in the office was 399. Okay to start azithromycin Monday Wednesday Friday.  Garner Nash, DO [facility] 02/18/2020 10:44 AM

## 2020-02-18 NOTE — Patient Instructions (Addendum)
Thank you for visiting Dr. Valeta Harms at [facility]. Today we recommend the following:  Planned Nodule CT follow up in April 2022  ECG today.   Meds ordered this encounter  Medications  . albuterol (VENTOLIN HFA) 108 (90 Base) MCG/ACT inhaler    Sig: Inhale 1-2 puffs into the lungs every 6 (six) hours as needed for wheezing or shortness of breath.    Dispense:  18 g    Refill:  11  . Fluticasone-Umeclidin-Vilant (TRELEGY ELLIPTA) 100-62.5-25 MCG/INH AEPB    Sig: Inhale 1 puff into the lungs daily.    Dispense:  3 each    Refill:  3  . azithromycin (ZITHROMAX) 250 MG tablet    Sig: Take 1 tablet (250 mg total) by mouth every Monday, Wednesday, and Friday.    Dispense:  36 tablet    Refill:  1   Return in about 3 months (around 05/18/2020) for with APP or Dr. Valeta Harms.  Can see me or Eric Form, NP  Will need ECG at next office visit to look at [facility].    Please do your part to reduce the spread of COVID-19.

## 2020-02-23 ENCOUNTER — Other Ambulatory Visit: Payer: Self-pay | Admitting: Primary Care

## 2020-03-10 DIAGNOSIS — J449 Chronic obstructive pulmonary disease, unspecified: Secondary | ICD-10-CM | POA: Diagnosis not present

## 2020-03-10 DIAGNOSIS — J432 Centrilobular emphysema: Secondary | ICD-10-CM | POA: Diagnosis not present

## 2020-03-10 DIAGNOSIS — R69 Illness, unspecified: Secondary | ICD-10-CM | POA: Diagnosis not present

## 2020-03-10 DIAGNOSIS — R197 Diarrhea, unspecified: Secondary | ICD-10-CM | POA: Diagnosis not present

## 2020-03-10 DIAGNOSIS — R636 Underweight: Secondary | ICD-10-CM | POA: Diagnosis not present

## 2020-03-10 DIAGNOSIS — Z681 Body mass index (BMI) 19 or less, adult: Secondary | ICD-10-CM | POA: Diagnosis not present

## 2020-03-10 DIAGNOSIS — J441 Chronic obstructive pulmonary disease with (acute) exacerbation: Secondary | ICD-10-CM | POA: Diagnosis not present

## 2020-03-10 DIAGNOSIS — E559 Vitamin D deficiency, unspecified: Secondary | ICD-10-CM | POA: Diagnosis not present

## 2020-04-06 DIAGNOSIS — J441 Chronic obstructive pulmonary disease with (acute) exacerbation: Secondary | ICD-10-CM | POA: Diagnosis not present

## 2020-04-06 DIAGNOSIS — E559 Vitamin D deficiency, unspecified: Secondary | ICD-10-CM | POA: Diagnosis not present

## 2020-04-07 DIAGNOSIS — J449 Chronic obstructive pulmonary disease, unspecified: Secondary | ICD-10-CM | POA: Diagnosis not present

## 2020-04-24 ENCOUNTER — Telehealth: Payer: Self-pay | Admitting: Pulmonary Disease

## 2020-04-24 NOTE — Telephone Encounter (Signed)
I have left another message for the patient to return my call [phone]

## 2020-04-27 NOTE — Telephone Encounter (Signed)
I have spoke with Amanda Cobb and she thinks her insurance will completely pay for this CT. I tried to explain to her that she would probably have to pay partly for the CT because this would not be her annual LCS CT but 6 months follow up. I told her I would call to see what part she might have to pay

## 2020-04-30 NOTE — Telephone Encounter (Signed)
I have spoke with Amanda Cobb and let her know that her insurance will cover the CT but she will have to pay 120.00 copay. She is aware that her LCS CT has been scheduled at [place] on 05/13/20 @ 9:00am

## 2020-05-06 DIAGNOSIS — M810 Age-related osteoporosis without current pathological fracture: Secondary | ICD-10-CM | POA: Diagnosis not present

## 2020-05-06 DIAGNOSIS — J449 Chronic obstructive pulmonary disease, unspecified: Secondary | ICD-10-CM | POA: Diagnosis not present

## 2020-05-08 DIAGNOSIS — J449 Chronic obstructive pulmonary disease, unspecified: Secondary | ICD-10-CM | POA: Diagnosis not present

## 2020-05-13 ENCOUNTER — Other Ambulatory Visit: Payer: Self-pay

## 2020-05-13 ENCOUNTER — Ambulatory Visit (INDEPENDENT_AMBULATORY_CARE_PROVIDER_SITE_OTHER)
Admission: RE | Admit: 2020-05-13 | Discharge: 2020-05-13 | Disposition: A | Discharge status: Home / Self Care | Payer: Medicare HMO | Source: Ambulatory Visit | Attending: Nurse Practitioner | Admitting: Nurse Practitioner

## 2020-05-13 DIAGNOSIS — J929 Pleural plaque without asbestos: Secondary | ICD-10-CM | POA: Diagnosis not present

## 2020-05-13 DIAGNOSIS — J432 Centrilobular emphysema: Secondary | ICD-10-CM | POA: Diagnosis not present

## 2020-05-13 DIAGNOSIS — I2510 Atherosclerotic heart disease of native coronary artery without angina pectoris: Secondary | ICD-10-CM | POA: Diagnosis not present

## 2020-05-13 DIAGNOSIS — J984 Other disorders of lung: Secondary | ICD-10-CM | POA: Diagnosis not present

## 2020-05-13 DIAGNOSIS — Z87891 Personal history of nicotine dependence: Secondary | ICD-10-CM

## 2020-05-20 ENCOUNTER — Telehealth: Payer: Self-pay | Admitting: Pulmonary Disease

## 2020-05-20 DIAGNOSIS — Z87891 Personal history of nicotine dependence: Secondary | ICD-10-CM

## 2020-05-21 NOTE — Telephone Encounter (Signed)
Pt informed of CT results per Sarah Groce, NP.  PT verbalized understanding.  Copy sent to PCP.  Order placed for 1 yr f/u CT.  

## 2020-06-01 NOTE — Progress Notes (Signed)

## 2020-06-07 DIAGNOSIS — J449 Chronic obstructive pulmonary disease, unspecified: Secondary | ICD-10-CM | POA: Diagnosis not present

## 2020-06-11 ENCOUNTER — Ambulatory Visit: Payer: Medicare HMO | Admitting: Pulmonary Disease

## 2020-06-11 ENCOUNTER — Other Ambulatory Visit: Payer: Self-pay

## 2020-06-11 ENCOUNTER — Encounter: Payer: Self-pay | Admitting: Pulmonary Disease

## 2020-06-11 VITALS — BP 124/70 | HR 73 | Temp 98.1°F | Ht 63.0 in | Wt 97.8 lb

## 2020-06-11 DIAGNOSIS — J432 Centrilobular emphysema: Secondary | ICD-10-CM | POA: Diagnosis not present

## 2020-06-11 DIAGNOSIS — Z87891 Personal history of nicotine dependence: Secondary | ICD-10-CM

## 2020-06-11 DIAGNOSIS — J9611 Chronic respiratory failure with hypoxia: Secondary | ICD-10-CM

## 2020-06-11 DIAGNOSIS — J449 Chronic obstructive pulmonary disease, unspecified: Secondary | ICD-10-CM | POA: Diagnosis not present

## 2020-06-11 DIAGNOSIS — R911 Solitary pulmonary nodule: Secondary | ICD-10-CM | POA: Diagnosis not present

## 2020-06-11 NOTE — Progress Notes (Signed)
Synopsis: Referred in Novermber 2019 for COPD.  Subjective:   PATIENT ID: Amanda Cobb GENDER: female DOB: 1950/06/01, MRN: [id number]  Chief Complaint  Patient presents with  . Follow-up    Pt is here today to discuss results of recent CT.  Pt states she has been doing okay since last visit. States she still becomes SOB if she overexerts herself.    OV 12/2017: PMH of GERD and COPD. She was diagnosed with COPD by office spiro about 6 years ago. Quit smoking 3 years ago, smoked from age 6 to 17, about 1 ppd for 50 years.  Overall has been doing well but does notice cough on occasion.  She does have some shortness of breath.  Her cough is productive.  She denies fevers denies hemoptysis.  Her cough at times is strong enough to make her vomit.  She does have significant coughing spells worse in the mornings.  She is currently managed using a Trelegy inhaler.  She is not really sure that this made a whole lot of difference.  She used to be on Symbicort (she believes) in the past and she thought that this helped.  OV 12/20/2018: Virtual Visit via Telephone Note Patient with COPD.  Increased cough sputum production shortness of breath and wheezing at home. Observations/Objective: Patient able to speak in complete sentences. Assessment and Plan: Acute exacerbation of COPD -Start prednisone taper -Z-Pak -Refill Trelegy  OV 02/18/2020: Here today for follow-up regarding COPD.  Having ongoing bouts of cough and shortness of breath.  Daily sputum production.  Recent exacerbation requiring antibiotics.  She also felt to have a urinary tract infection.  Doing better at this time but still has cough and dyspnea on exertion.  She tries to walk on her treadmill but has had difficulty with this as well.  Taking her Trelegy regularly.  Patient had new lung nodule on recent CT imaging in October.  Follow-up scheduled for 6 months in April 2022.  OV 06/11/2020: Here to day for follow up regarding copd and  nodules.  Here today doing well.  Started on Trelegy as well as azithromycin Monday Wednesday Friday.  Her shortness of breath and exertional dyspnea have almost resolved.  She is able to keep up with her multiple grandchildren and great-grandchildren.  Very happy about her stability at this time.  She had recent lung cancer screening CT that shows a left upper lobe pulmonary nodule that 7.6 mm stable in size from the previous 79-month follow-up.  She is enrolled in our lung cancer screening program and will have a 1 year CT scan already ordered for her.    Past Medical History:  Diagnosis Date  . Allergic rhinitis   . Anemia   . Anxiety   . COPD (chronic obstructive pulmonary disease) ([place])   . Crohn disease ([place])   . Esophageal dysphagia   . Family history of colonic polyps   . GERD (gastroesophageal reflux disease)   . Hiatal hernia   . History of colon polyps   . History of Helicobacter pylori infection   . Hyperlipidemia   . Pituitary microadenoma ([place])   . RLS (restless legs syndrome)      Family History  Problem Relation Age of Onset  . Dementia Mother   . Colon polyps Mother   . Asthma Father   . COPD Sister   . Depression Sister   . Depression Sister   . COPD Sister   . Colon cancer Neg Hx   .  Esophageal cancer Neg Hx   . Rectal cancer Neg Hx   . Stomach cancer Neg Hx       Past Surgical History:  Procedure Laterality Date  . COLONOSCOPY  07/21/2016   Colonic polup status post polypectomy. Few erosions and a small ulcer in the terminal ileum ? Importance- could represent Crohn's disease (biopsied)   . ESOPHAGOGASTRODUODENOSCOPY  07/21/2016   Distal esophageal stricture status post esophageal dilatation. Small epiphrenic diverticulum. Small hiatal hernia. Incidental duodenal diverticulum  . GALLBLADDER SURGERY    . TONSILLECTOMY    . TUBAL LIGATION      Social History   Socioeconomic History  . Marital status: Married    Spouse name: Not on file  . Number  of children: Not on file  . Years of education: Not on file  . Highest education level: Not on file  Occupational History  . Not on file  Tobacco Use  . Smoking status: Former Smoker    Packs/day: 1.00    Years: 51.00    Pack years: 51.00    Types: Cigarettes    Start date: 1965    Quit date: 2017    Years since quitting: 5.3  . Smokeless tobacco: Never Used  Vaping Use  . Vaping Use: Never used  Substance and Sexual Activity  . Alcohol use: Yes    Comment: Occasional, social  . Drug use: Never  . Sexual activity: Not on file  Other Topics Concern  . Not on file  Social History Narrative  . Not on file   Social Determinants of Health   Financial Resource Strain: Not on file  Food Insecurity: Not on file  Transportation Needs: Not on file  Physical Activity: Not on file  Stress: Not on file  Social Connections: Not on file  Intimate Partner Violence: Not on file     Allergies  Allergen Reactions  . Codeine Nausea And Vomiting     Outpatient Medications Prior to Visit  Medication Sig Dispense Refill  . albuterol (VENTOLIN HFA) 108 (90 Base) MCG/ACT inhaler Inhale 1-2 puffs into the lungs every 6 (six) hours as needed for wheezing or shortness of breath. 18 g 11  . alendronate (FOSAMAX) 70 MG tablet Take 70 mg by mouth once a week.    . ALPRAZolam (XANAX) 0.5 MG tablet TAKE 1 & 1/2 TABLET BY MOUTH AT BEDTIME  0  . atorvastatin (LIPITOR) 20 MG tablet Take 20 mg by mouth every other day.  2  . benzonatate (TESSALON) 100 MG capsule Take 2 capsules (200 mg total) by mouth 3 (three) times daily as needed for cough. 60 capsule 1  . calcium carbonate (OS-CAL - DOSED IN MG OF ELEMENTAL CALCIUM) 1250 (500 Ca) MG tablet Take 1 tablet by mouth.    . dextromethorphan-guaiFENesin (MUCINEX DM) 30-600 MG 12hr tablet Take 1 tablet by mouth 2 (two) times daily as needed for cough.    . escitalopram (LEXAPRO) 10 MG tablet Take 10 mg by mouth daily.    . fluticasone (FLONASE) 50  MCG/ACT nasal spray Place 1 spray into both nostrils 2 (two) times daily.    . Fluticasone-Umeclidin-Vilant (TRELEGY ELLIPTA) 100-62.5-25 MCG/INH AEPB Inhale 1 puff into the lungs daily. 3 each 3  . ipratropium-albuterol (DUONEB) 0.5-2.5 (3) MG/3ML SOLN INHALE 3 MILLILITER EVERY 6 HOURS AS NEEDED FOR COPD EXACERBATION    . montelukast (SINGULAIR) 10 MG tablet TAKE 1 TABLET BY MOUTH EVERYDAY AT BEDTIME 90 tablet 3  . pantoprazole (PROTONIX) 40 MG  tablet Take 1 tablet (40 mg total) by mouth daily. 90 tablet 3  . famotidine (PEPCID) 40 MG tablet Take 1 tablet (40 mg total) by mouth at bedtime. 30 tablet 3  . ondansetron (ZOFRAN-ODT) 8 MG disintegrating tablet Take 8 mg by mouth every 8 (eight) hours as needed.     No facility-administered medications prior to visit.    Review of Systems  Constitutional: Negative for chills, fever, malaise/fatigue and weight loss.  HENT: Negative for hearing loss, sore throat and tinnitus.   Eyes: Negative for blurred vision and double vision.  Respiratory: Negative for cough, hemoptysis, sputum production, shortness of breath, wheezing and stridor.   Cardiovascular: Negative for chest pain, palpitations, orthopnea, leg swelling and PND.  Gastrointestinal: Negative for abdominal pain, constipation, diarrhea, heartburn, nausea and vomiting.  Genitourinary: Negative for dysuria, hematuria and urgency.  Musculoskeletal: Negative for joint pain and myalgias.  Skin: Negative for itching and rash.  Neurological: Negative for dizziness, tingling, weakness and headaches.  Endo/Heme/Allergies: Negative for environmental allergies. Does not bruise/bleed easily.  Psychiatric/Behavioral: Negative for depression. The patient is not nervous/anxious and does not have insomnia.   All other systems reviewed and are negative.    Objective:  Physical Exam Vitals reviewed.  Constitutional:      General: She is not in acute distress.    Appearance: She is well-developed.   HENT:     Head: Normocephalic and atraumatic.     Mouth/Throat:     Pharynx: No oropharyngeal exudate.  Eyes:     Conjunctiva/sclera: Conjunctivae normal.     Pupils: Pupils are equal, round, and reactive to light.  Neck:     Vascular: No JVD.     Trachea: No tracheal deviation.     Comments: Loss of supraclavicular fat Cardiovascular:     Rate and Rhythm: Normal rate and regular rhythm.     Heart sounds: S1 normal and S2 normal.     Comments: Distant heart tones Pulmonary:     Effort: No tachypnea or accessory muscle usage.     Breath sounds: No stridor. Decreased breath sounds (throughout all lung fields) present. No wheezing, rhonchi or rales.  Abdominal:     General: Bowel sounds are normal. There is no distension.     Palpations: Abdomen is soft.     Tenderness: There is no abdominal tenderness.  Musculoskeletal:        General: Deformity (muscle wasting ) present.  Skin:    General: Skin is warm and dry.     Capillary Refill: Capillary refill takes less than 2 seconds.     Findings: No rash.  Neurological:     Mental Status: She is alert and oriented to person, place, and time.  Psychiatric:        Behavior: Behavior normal.      Vitals:   06/11/20 1525  BP: 124/70  Pulse: 73  Temp: 98.1 F (36.7 C)  TempSrc: Temporal  SpO2: 100%  Weight: 97 lb 12.8 oz (44.4 kg)  Height: 5\' 3"  (1.6 m)   100% on RA BMI Readings from Last 3 Encounters:  06/11/20 17.32 kg/m  02/18/20 18.25 kg/m  10/17/19 19.20 kg/m   Wt Readings from Last 3 Encounters:  06/11/20 97 lb 12.8 oz (44.4 kg)  02/18/20 103 lb (46.7 kg)  10/17/19 105 lb (47.6 kg)     CBC    Component Value Date/Time   WBC 7.0 09/18/2019 1208   RBC 4.23 09/18/2019 1208   HGB 12.2 09/18/2019  1208   HCT 36.2 09/18/2019 1208   PLT 328.0 09/18/2019 1208   MCV 85.7 09/18/2019 1208   MCH 28.8 11/16/2018 0844   MCHC 33.6 09/18/2019 1208   RDW 14.7 09/18/2019 1208   LYMPHSABS 1.4 09/18/2019 1208    MONOABS 0.8 09/18/2019 1208   EOSABS 0.4 09/18/2019 1208   BASOSABS 0.1 09/18/2019 1208    Chest Imaging:  CT chest October 2021: New to left lower lobe pulmonary nodules 1 at 5.1 mm it was not present before recommending repeat follow-up in April 2022. The patient's images have been independently reviewed by me.    April 2022: CT lung cancer screening lung RADS 2 Left upper lobe 7.6 mm pulmonary nodule The patient's images have been independently reviewed by me.    Pulmonary Functions Testing Results:  PFT Results Latest Ref Rng & Units 12/11/2018  FVC-Pre L 1.27  FVC-Predicted Pre % 46  FVC-Post L 1.52  FVC-Predicted Post % 56  Pre FEV1/FVC % % 38  Post FEV1/FCV % % 38  FEV1-Pre L 0.48  FEV1-Predicted Pre % 23  FEV1-Post L 0.58  DLCO uncorrected ml/min/mmHg 10.11  DLCO UNC% % 56  DLVA Predicted % 74  TLC L 4.86  TLC % Predicted % 104  RV % Predicted % 171    FeNO: None   Pathology: None   Echocardiogram: None   Heart Catheterization: None     Assessment & Plan:   Stage 4 very severe COPD by GOLD classification ([place])  Chronic hypoxemic respiratory failure (HCC)  Centrilobular emphysema (HCC)  Former smoker  Left upper lobe pulmonary nodule   Discussion:  This is a 70 year old, former smoker, severe COPD baseline, advanced emphysema, currently managed on triple therapy inhaler, Trelegy as well as azithromycin Monday Wednesday Friday.  Not vaccinated.  Plan: Continue use of azithromycin Monday Wednesday Friday 250 mg Continue Trelegy 100 once daily No refills needed at this time. Continue annual lung cancer screening CT.  She does have a lung nodule that is borderline suspicious it is however small and stable for the past 6 months.  Should have repeat CT scan in 1 year.    Current Outpatient Medications:  .  albuterol (VENTOLIN HFA) 108 (90 Base) MCG/ACT inhaler, Inhale 1-2 puffs into the lungs every 6 (six) hours as needed for wheezing or shortness  of breath., Disp: 18 g, Rfl: 11 .  alendronate (FOSAMAX) 70 MG tablet, Take 70 mg by mouth once a week., Disp: , Rfl:  .  ALPRAZolam (XANAX) 0.5 MG tablet, TAKE 1 & 1/2 TABLET BY MOUTH AT BEDTIME, Disp: , Rfl: 0 .  atorvastatin (LIPITOR) 20 MG tablet, Take 20 mg by mouth every other day., Disp: , Rfl: 2 .  benzonatate (TESSALON) 100 MG capsule, Take 2 capsules (200 mg total) by mouth 3 (three) times daily as needed for cough., Disp: 60 capsule, Rfl: 1 .  calcium carbonate (OS-CAL - DOSED IN MG OF ELEMENTAL CALCIUM) 1250 (500 Ca) MG tablet, Take 1 tablet by mouth., Disp: , Rfl:  .  dextromethorphan-guaiFENesin (MUCINEX DM) 30-600 MG 12hr tablet, Take 1 tablet by mouth 2 (two) times daily as needed for cough., Disp: , Rfl:  .  escitalopram (LEXAPRO) 10 MG tablet, Take 10 mg by mouth daily., Disp: , Rfl:  .  fluticasone (FLONASE) 50 MCG/ACT nasal spray, Place 1 spray into both nostrils 2 (two) times daily., Disp: , Rfl:  .  Fluticasone-Umeclidin-Vilant (TRELEGY ELLIPTA) 100-62.5-25 MCG/INH AEPB, Inhale 1 puff into the lungs daily.,  Disp: 3 each, Rfl: 3 .  ipratropium-albuterol (DUONEB) 0.5-2.5 (3) MG/3ML SOLN, INHALE 3 MILLILITER EVERY 6 HOURS AS NEEDED FOR COPD EXACERBATION, Disp: , Rfl:  .  montelukast (SINGULAIR) 10 MG tablet, TAKE 1 TABLET BY MOUTH EVERYDAY AT BEDTIME, Disp: 90 tablet, Rfl: 3 .  pantoprazole (PROTONIX) 40 MG tablet, Take 1 tablet (40 mg total) by mouth daily., Disp: 90 tablet, Rfl: 3   Garner Nash, DO [facility] 06/11/2020 3:57 PM

## 2020-06-11 NOTE — Patient Instructions (Signed)
Thank you for visiting Dr. Valeta Harms at [facility]. Today we recommend the following:  Continue trelegy Continue azithromycin MWF  Return in about 6 months (around 12/12/2020) for with APP or Dr. Valeta Harms.    Please do your part to reduce the spread of COVID-19.

## 2020-07-08 DIAGNOSIS — J449 Chronic obstructive pulmonary disease, unspecified: Secondary | ICD-10-CM | POA: Diagnosis not present

## 2020-07-09 DIAGNOSIS — R300 Dysuria: Secondary | ICD-10-CM | POA: Diagnosis not present

## 2020-07-09 DIAGNOSIS — Z681 Body mass index (BMI) 19 or less, adult: Secondary | ICD-10-CM | POA: Diagnosis not present

## 2020-08-03 ENCOUNTER — Telehealth: Payer: Self-pay

## 2020-08-03 NOTE — Telephone Encounter (Signed)
Refill request sent in from pharmacy for Azithromycin every MWF. Is it ok to refill? Last OV is 06/11/20  Dr. Valeta Harms please advise   [place], Technical sales engineer

## 2020-08-05 MED ORDER — AZITHROMYCIN 250 MG PO TABS: ORAL_TABLET | ORAL | 4 refills | Status: DC

## 2020-08-05 NOTE — Telephone Encounter (Signed)
Azithromycin sent into the pharmacy. I put a recall in for patient to be schedule in November for a follow-up  Nothing further needed at this time.

## 2020-08-07 DIAGNOSIS — J449 Chronic obstructive pulmonary disease, unspecified: Secondary | ICD-10-CM | POA: Diagnosis not present

## 2020-09-07 DIAGNOSIS — J449 Chronic obstructive pulmonary disease, unspecified: Secondary | ICD-10-CM | POA: Diagnosis not present

## 2020-09-09 ENCOUNTER — Telehealth: Payer: Self-pay | Admitting: Emergency Medicine

## 2020-09-09 NOTE — Telephone Encounter (Signed)
Called and spoke with patient. Advised that we do not have any samples of Trelegy. Patient states that her insurance is in the donut hole. I asked patient if she has ever tried patient assistance program. Patient states that she has not. I advised her that she would need proof of spending $600 out of pocket. Patient states she will check into it and give [place] a call back. Patient also asked if we could call her once we get more Trelegy samples in. I advised her that I could not promise that someone would remember to call her back, told her that she could call the office back to check status of samples. Patient verbalized understanding.  Nothing further needed at this time.

## 2020-09-10 ENCOUNTER — Telehealth: Payer: Self-pay | Admitting: Emergency Medicine

## 2020-09-10 NOTE — Telephone Encounter (Signed)
[place] forms printed and given to pt  She will return once she has completed her portion, and then we can fax this along with RX  Will hold triage for f/u since BI nurse not in office rest of the wk

## 2020-09-14 ENCOUNTER — Other Ambulatory Visit: Payer: Self-pay | Admitting: Gastroenterology

## 2020-09-14 DIAGNOSIS — Z01818 Encounter for other preprocedural examination: Secondary | ICD-10-CM

## 2020-09-14 DIAGNOSIS — R1310 Dysphagia, unspecified: Secondary | ICD-10-CM

## 2020-09-14 DIAGNOSIS — K219 Gastro-esophageal reflux disease without esophagitis: Secondary | ICD-10-CM

## 2020-09-15 NOTE — Telephone Encounter (Signed)
Patient returned completed [place] patient assistance forms via mail.  I put them in Dr. Agustina Caroli box for Leslie's attention.

## 2020-09-16 DIAGNOSIS — Z1231 Encounter for screening mammogram for malignant neoplasm of breast: Secondary | ICD-10-CM | POA: Diagnosis not present

## 2020-09-17 ENCOUNTER — Other Ambulatory Visit: Payer: Self-pay

## 2020-09-17 MED ORDER — TRELEGY ELLIPTA 100-62.5-25 MCG/INH IN AEPB: 1.0000 | INHALATION_SPRAY | Freq: Every day | RESPIRATORY_TRACT | 3 refills | Status: DC

## 2020-09-29 ENCOUNTER — Other Ambulatory Visit: Payer: Self-pay | Admitting: Gastroenterology

## 2020-10-06 DIAGNOSIS — R42 Dizziness and giddiness: Secondary | ICD-10-CM | POA: Diagnosis not present

## 2020-10-06 DIAGNOSIS — Z743 Need for continuous supervision: Secondary | ICD-10-CM | POA: Diagnosis not present

## 2020-10-06 DIAGNOSIS — Z7982 Long term (current) use of aspirin: Secondary | ICD-10-CM | POA: Diagnosis not present

## 2020-10-06 DIAGNOSIS — R231 Pallor: Secondary | ICD-10-CM | POA: Diagnosis not present

## 2020-10-06 DIAGNOSIS — J449 Chronic obstructive pulmonary disease, unspecified: Secondary | ICD-10-CM | POA: Diagnosis not present

## 2020-10-06 DIAGNOSIS — I10 Essential (primary) hypertension: Secondary | ICD-10-CM | POA: Diagnosis not present

## 2020-10-06 DIAGNOSIS — Z79899 Other long term (current) drug therapy: Secondary | ICD-10-CM | POA: Diagnosis not present

## 2020-10-06 DIAGNOSIS — K219 Gastro-esophageal reflux disease without esophagitis: Secondary | ICD-10-CM | POA: Diagnosis not present

## 2020-10-06 DIAGNOSIS — E785 Hyperlipidemia, unspecified: Secondary | ICD-10-CM | POA: Diagnosis not present

## 2020-10-06 DIAGNOSIS — R112 Nausea with vomiting, unspecified: Secondary | ICD-10-CM | POA: Diagnosis not present

## 2020-10-06 DIAGNOSIS — Z87891 Personal history of nicotine dependence: Secondary | ICD-10-CM | POA: Diagnosis not present

## 2020-10-07 DIAGNOSIS — J449 Chronic obstructive pulmonary disease, unspecified: Secondary | ICD-10-CM | POA: Diagnosis not present

## 2020-10-07 DIAGNOSIS — K219 Gastro-esophageal reflux disease without esophagitis: Secondary | ICD-10-CM | POA: Diagnosis not present

## 2020-10-07 DIAGNOSIS — R112 Nausea with vomiting, unspecified: Secondary | ICD-10-CM | POA: Diagnosis not present

## 2020-10-07 DIAGNOSIS — Z7982 Long term (current) use of aspirin: Secondary | ICD-10-CM | POA: Diagnosis not present

## 2020-10-07 DIAGNOSIS — R42 Dizziness and giddiness: Secondary | ICD-10-CM | POA: Diagnosis not present

## 2020-10-07 DIAGNOSIS — Z79899 Other long term (current) drug therapy: Secondary | ICD-10-CM | POA: Diagnosis not present

## 2020-10-07 DIAGNOSIS — Z87891 Personal history of nicotine dependence: Secondary | ICD-10-CM | POA: Diagnosis not present

## 2020-10-07 DIAGNOSIS — E785 Hyperlipidemia, unspecified: Secondary | ICD-10-CM | POA: Diagnosis not present

## 2020-10-08 DIAGNOSIS — J449 Chronic obstructive pulmonary disease, unspecified: Secondary | ICD-10-CM | POA: Diagnosis not present

## 2020-10-13 DIAGNOSIS — H6991 Unspecified Eustachian tube disorder, right ear: Secondary | ICD-10-CM | POA: Diagnosis not present

## 2020-10-13 DIAGNOSIS — Z0000 Encounter for general adult medical examination without abnormal findings: Secondary | ICD-10-CM | POA: Diagnosis not present

## 2020-10-13 DIAGNOSIS — R112 Nausea with vomiting, unspecified: Secondary | ICD-10-CM | POA: Diagnosis not present

## 2020-10-13 DIAGNOSIS — J449 Chronic obstructive pulmonary disease, unspecified: Secondary | ICD-10-CM | POA: Diagnosis not present

## 2020-10-13 DIAGNOSIS — H8113 Benign paroxysmal vertigo, bilateral: Secondary | ICD-10-CM | POA: Diagnosis not present

## 2020-10-13 DIAGNOSIS — E782 Mixed hyperlipidemia: Secondary | ICD-10-CM | POA: Diagnosis not present

## 2020-10-13 DIAGNOSIS — J309 Allergic rhinitis, unspecified: Secondary | ICD-10-CM | POA: Diagnosis not present

## 2020-10-13 DIAGNOSIS — M810 Age-related osteoporosis without current pathological fracture: Secondary | ICD-10-CM | POA: Diagnosis not present

## 2020-10-13 DIAGNOSIS — R69 Illness, unspecified: Secondary | ICD-10-CM | POA: Diagnosis not present

## 2020-10-13 DIAGNOSIS — Z79899 Other long term (current) drug therapy: Secondary | ICD-10-CM | POA: Diagnosis not present

## 2020-10-28 DIAGNOSIS — R2689 Other abnormalities of gait and mobility: Secondary | ICD-10-CM | POA: Diagnosis not present

## 2020-10-28 DIAGNOSIS — M2560 Stiffness of unspecified joint, not elsewhere classified: Secondary | ICD-10-CM | POA: Diagnosis not present

## 2020-10-28 DIAGNOSIS — R293 Abnormal posture: Secondary | ICD-10-CM | POA: Diagnosis not present

## 2020-10-28 DIAGNOSIS — H8113 Benign paroxysmal vertigo, bilateral: Secondary | ICD-10-CM | POA: Diagnosis not present

## 2020-10-28 DIAGNOSIS — M6281 Muscle weakness (generalized): Secondary | ICD-10-CM | POA: Diagnosis not present

## 2020-10-28 DIAGNOSIS — R2681 Unsteadiness on feet: Secondary | ICD-10-CM | POA: Diagnosis not present

## 2020-11-04 DIAGNOSIS — M2560 Stiffness of unspecified joint, not elsewhere classified: Secondary | ICD-10-CM | POA: Diagnosis not present

## 2020-11-04 DIAGNOSIS — H8113 Benign paroxysmal vertigo, bilateral: Secondary | ICD-10-CM | POA: Diagnosis not present

## 2020-11-04 DIAGNOSIS — M6281 Muscle weakness (generalized): Secondary | ICD-10-CM | POA: Diagnosis not present

## 2020-11-04 DIAGNOSIS — R2689 Other abnormalities of gait and mobility: Secondary | ICD-10-CM | POA: Diagnosis not present

## 2020-11-04 DIAGNOSIS — R2681 Unsteadiness on feet: Secondary | ICD-10-CM | POA: Diagnosis not present

## 2020-11-04 DIAGNOSIS — R293 Abnormal posture: Secondary | ICD-10-CM | POA: Diagnosis not present

## 2020-11-07 DIAGNOSIS — J449 Chronic obstructive pulmonary disease, unspecified: Secondary | ICD-10-CM | POA: Diagnosis not present

## 2020-11-11 DIAGNOSIS — M6281 Muscle weakness (generalized): Secondary | ICD-10-CM | POA: Diagnosis not present

## 2020-11-11 DIAGNOSIS — M2560 Stiffness of unspecified joint, not elsewhere classified: Secondary | ICD-10-CM | POA: Diagnosis not present

## 2020-11-11 DIAGNOSIS — R2689 Other abnormalities of gait and mobility: Secondary | ICD-10-CM | POA: Diagnosis not present

## 2020-11-11 DIAGNOSIS — R2681 Unsteadiness on feet: Secondary | ICD-10-CM | POA: Diagnosis not present

## 2020-11-11 DIAGNOSIS — H8113 Benign paroxysmal vertigo, bilateral: Secondary | ICD-10-CM | POA: Diagnosis not present

## 2020-11-11 DIAGNOSIS — R293 Abnormal posture: Secondary | ICD-10-CM | POA: Diagnosis not present

## 2020-11-23 DIAGNOSIS — Z681 Body mass index (BMI) 19 or less, adult: Secondary | ICD-10-CM | POA: Diagnosis not present

## 2020-11-23 DIAGNOSIS — J449 Chronic obstructive pulmonary disease, unspecified: Secondary | ICD-10-CM | POA: Diagnosis not present

## 2020-11-23 DIAGNOSIS — J441 Chronic obstructive pulmonary disease with (acute) exacerbation: Secondary | ICD-10-CM | POA: Diagnosis not present

## 2020-11-25 DIAGNOSIS — H9311 Tinnitus, right ear: Secondary | ICD-10-CM | POA: Diagnosis not present

## 2020-11-25 DIAGNOSIS — H90A21 Sensorineural hearing loss, unilateral, right ear, with restricted hearing on the contralateral side: Secondary | ICD-10-CM | POA: Diagnosis not present

## 2020-12-08 DIAGNOSIS — J449 Chronic obstructive pulmonary disease, unspecified: Secondary | ICD-10-CM | POA: Diagnosis not present

## 2020-12-21 ENCOUNTER — Encounter: Payer: Self-pay | Admitting: Pulmonary Disease

## 2020-12-21 ENCOUNTER — Ambulatory Visit: Payer: Medicare HMO | Admitting: Pulmonary Disease

## 2020-12-21 ENCOUNTER — Other Ambulatory Visit: Payer: Self-pay

## 2020-12-21 VITALS — BP 122/72 | HR 87 | Temp 97.7°F | Ht 62.0 in | Wt 105.0 lb

## 2020-12-21 DIAGNOSIS — J432 Centrilobular emphysema: Secondary | ICD-10-CM

## 2020-12-21 DIAGNOSIS — Z87891 Personal history of nicotine dependence: Secondary | ICD-10-CM

## 2020-12-21 DIAGNOSIS — R911 Solitary pulmonary nodule: Secondary | ICD-10-CM

## 2020-12-21 DIAGNOSIS — J449 Chronic obstructive pulmonary disease, unspecified: Secondary | ICD-10-CM

## 2020-12-21 MED ORDER — AZITHROMYCIN 250 MG PO TABS: ORAL_TABLET | ORAL | 2 refills | Status: DC

## 2020-12-21 MED ORDER — TRELEGY ELLIPTA 100-62.5-25 MCG/ACT IN AEPB: 1.0000 | INHALATION_SPRAY | Freq: Every day | RESPIRATORY_TRACT | 0 refills | Status: DC

## 2020-12-21 NOTE — Addendum Note (Signed)
Addended by: Fran Lowes on: 12/21/2020 03:14 PM   Modules accepted: Orders

## 2020-12-21 NOTE — Patient Instructions (Addendum)
Thank you for visiting Dr. Valeta Harms at [facility]. Today we recommend the following:  Continue the trelegy  Albuterol as needed  Lung cancer screening again in April   Return in about 6 months (around 06/20/2021) for with APP or Dr. Valeta Harms.    Please do your part to reduce the spread of COVID-19.

## 2020-12-21 NOTE — Progress Notes (Signed)
Synopsis: Referred in Novermber 2019 for COPD.  Subjective:   PATIENT ID: Amanda Cobb GENDER: female DOB: 1951/01/06, MRN: [id number]  Chief Complaint  Patient presents with   Follow-up    Patient says she's been coughing a lot and feels congested.    OV 12/2017: PMH of GERD and COPD. She was diagnosed with COPD by office spiro about 6 years ago. Quit smoking 3 years ago, smoked from age 70 to 33, about 1 ppd for 50 years.  Overall has been doing well but does notice cough on occasion.  She does have some shortness of breath.  Her cough is productive.  She denies fevers denies hemoptysis.  Her cough at times is strong enough to make her vomit.  She does have significant coughing spells worse in the mornings.  She is currently managed using a Trelegy inhaler.  She is not really sure that this made a whole lot of difference.  She used to be on Symbicort (she believes) in the past and she thought that this helped.  OV 12/20/2018: Virtual Visit via Telephone Note Patient with COPD.  Increased cough sputum production shortness of breath and wheezing at home. Observations/Objective: Patient able to speak in complete sentences. Assessment and Plan: Acute exacerbation of COPD -Start prednisone taper -Z-Pak -Refill Trelegy  OV 02/18/2020: Here today for follow-up regarding COPD.  Having ongoing bouts of cough and shortness of breath.  Daily sputum production.  Recent exacerbation requiring antibiotics.  She also felt to have a urinary tract infection.  Doing better at this time but still has cough and dyspnea on exertion.  She tries to walk on her treadmill but has had difficulty with this as well.  Taking her Trelegy regularly.  Patient had new lung nodule on recent CT imaging in October.  Follow-up scheduled for 6 months in April 2022.  OV 06/11/2020: Here to day for follow up regarding copd and nodules.  Here today doing well.  Started on Trelegy as well as azithromycin Monday Wednesday Friday.   Her shortness of breath and exertional dyspnea have almost resolved.  She is able to keep up with her multiple grandchildren and great-grandchildren.  Very happy about her stability at this time.  She had recent lung cancer screening CT that shows a left upper lobe pulmonary nodule that 7.6 mm stable in size from the previous 20-month follow-up.  She is enrolled in our lung cancer screening program and will have a 1 year CT scan already ordered for her.  OV 12/21/2020: still having a few coughing spells, dry and non-productive. No recent exacerbations.  Doing well on her current regimen and.  She had a slight exacerbation possibly in September it sounds like.  She slowly been recovering with a lingering cough.  Using her Trelegy plus as needed albuterol.  As well as azithromycin Monday Wednesday Friday.    Past Medical History:  Diagnosis Date   Allergic rhinitis    Anemia    Anxiety    COPD (chronic obstructive pulmonary disease) (HCC)    Crohn disease (HCC)    Esophageal dysphagia    Family history of colonic polyps    GERD (gastroesophageal reflux disease)    Hiatal hernia    History of colon polyps    History of Helicobacter pylori infection    Hyperlipidemia    Pituitary microadenoma (HCC)    RLS (restless legs syndrome)      Family History  Problem Relation Age of Onset   Dementia Mother  Colon polyps Mother    Asthma Father    COPD Sister    Depression Sister    Depression Sister    COPD Sister    Colon cancer Neg Hx    Esophageal cancer Neg Hx    Rectal cancer Neg Hx    Stomach cancer Neg Hx       Past Surgical History:  Procedure Laterality Date   COLONOSCOPY  07/21/2016   Colonic polup status post polypectomy. Few erosions and a small ulcer in the terminal ileum ? Importance- could represent Crohn's disease (biopsied)    ESOPHAGOGASTRODUODENOSCOPY  07/21/2016   Distal esophageal stricture status post esophageal dilatation. Small epiphrenic diverticulum. Small  hiatal hernia. Incidental duodenal diverticulum   GALLBLADDER SURGERY     TONSILLECTOMY     TUBAL LIGATION      Social History   Socioeconomic History   Marital status: Married    Spouse name: Not on file   Number of children: Not on file   Years of education: Not on file   Highest education level: Not on file  Occupational History   Not on file  Tobacco Use   Smoking status: Former    Packs/day: 1.00    Years: 51.00    Pack years: 51.00    Types: Cigarettes    Start date: 46    Quit date: 2017    Years since quitting: 5.8   Smokeless tobacco: Never  Vaping Use   Vaping Use: Never used  Substance and Sexual Activity   Alcohol use: Yes    Comment: Occasional, social   Drug use: Never   Sexual activity: Not on file  Other Topics Concern   Not on file  Social History Narrative   Not on file   Social Determinants of Health   Financial Resource Strain: Not on file  Food Insecurity: Not on file  Transportation Needs: Not on file  Physical Activity: Not on file  Stress: Not on file  Social Connections: Not on file  Intimate Partner Violence: Not on file     Allergies  Allergen Reactions   Codeine Nausea And Vomiting     Outpatient Medications Prior to Visit  Medication Sig Dispense Refill   albuterol (VENTOLIN HFA) 108 (90 Base) MCG/ACT inhaler Inhale 1-2 puffs into the lungs every 6 (six) hours as needed for wheezing or shortness of breath. 18 g 11   alendronate (FOSAMAX) 70 MG tablet Take 70 mg by mouth once a week.     ALPRAZolam (XANAX) 0.5 MG tablet TAKE 1 & 1/2 TABLET BY MOUTH AT BEDTIME  0   atorvastatin (LIPITOR) 20 MG tablet Take 20 mg by mouth every other day.  2   azithromycin (ZITHROMAX) 250 MG tablet Take 1 tablet by mouth every Monday, Wednesday and Friday 12 each 4   benzonatate (TESSALON) 100 MG capsule Take 2 capsules (200 mg total) by mouth 3 (three) times daily as needed for cough. 60 capsule 1   calcium carbonate (OS-CAL - DOSED IN MG OF  ELEMENTAL CALCIUM) 1250 (500 Ca) MG tablet Take 1 tablet by mouth.     dextromethorphan-guaiFENesin (MUCINEX DM) 30-600 MG 12hr tablet Take 1 tablet by mouth 2 (two) times daily as needed for cough.     escitalopram (LEXAPRO) 10 MG tablet Take 10 mg by mouth daily.     fluticasone (FLONASE) 50 MCG/ACT nasal spray Place 1 spray into both nostrils 2 (two) times daily.     Fluticasone-Umeclidin-Vilant (TRELEGY ELLIPTA) 100-62.5-25 MCG/INH  AEPB Inhale 1 puff into the lungs daily. 3 each 3   ipratropium-albuterol (DUONEB) 0.5-2.5 (3) MG/3ML SOLN INHALE 3 MILLILITER EVERY 6 HOURS AS NEEDED FOR COPD EXACERBATION     montelukast (SINGULAIR) 10 MG tablet TAKE 1 TABLET BY MOUTH EVERYDAY AT BEDTIME 90 tablet 3   pantoprazole (PROTONIX) 40 MG tablet Take 1 tablet by mouth once daily 90 tablet 0   No facility-administered medications prior to visit.    Review of Systems  Constitutional:  Negative for chills, fever, malaise/fatigue and weight loss.  HENT:  Negative for hearing loss, sore throat and tinnitus.   Eyes:  Negative for blurred vision and double vision.  Respiratory:  Positive for cough and shortness of breath. Negative for hemoptysis, sputum production, wheezing and stridor.   Cardiovascular:  Negative for chest pain, palpitations, orthopnea, leg swelling and PND.  Gastrointestinal:  Negative for abdominal pain, constipation, diarrhea, heartburn, nausea and vomiting.  Genitourinary:  Negative for dysuria, hematuria and urgency.  Musculoskeletal:  Negative for joint pain and myalgias.  Skin:  Negative for itching and rash.  Neurological:  Negative for dizziness, tingling, weakness and headaches.  Endo/Heme/Allergies:  Negative for environmental allergies. Does not bruise/bleed easily.  Psychiatric/Behavioral:  Negative for depression. The patient is not nervous/anxious and does not have insomnia.   All other systems reviewed and are negative.   Objective:  Physical Exam Vitals reviewed.   Constitutional:      General: She is not in acute distress.    Appearance: She is well-developed.  HENT:     Head: Normocephalic and atraumatic.     Mouth/Throat:     Pharynx: No oropharyngeal exudate.  Eyes:     Conjunctiva/sclera: Conjunctivae normal.     Pupils: Pupils are equal, round, and reactive to light.  Neck:     Vascular: No JVD.     Trachea: No tracheal deviation.     Comments: Loss of supraclavicular fat Cardiovascular:     Rate and Rhythm: Normal rate and regular rhythm.     Heart sounds: S1 normal and S2 normal.     Comments: Distant heart tones Pulmonary:     Effort: No tachypnea or accessory muscle usage.     Breath sounds: No stridor. Decreased breath sounds (throughout all lung fields) present. No wheezing, rhonchi or rales.  Abdominal:     General: Bowel sounds are normal. There is no distension.     Palpations: Abdomen is soft.     Tenderness: There is no abdominal tenderness.  Musculoskeletal:        General: Deformity (muscle wasting ) present.  Skin:    General: Skin is warm and dry.     Capillary Refill: Capillary refill takes less than 2 seconds.     Findings: No rash.  Neurological:     Mental Status: She is alert and oriented to person, place, and time.  Psychiatric:        Behavior: Behavior normal.     Vitals:   12/21/20 1340  BP: 122/72  Pulse: 87  Temp: 97.7 F (36.5 C)  TempSrc: Oral  SpO2: 94%  Weight: 105 lb (47.6 kg)  Height: 5\' 2"  (1.575 m)   94% on RA BMI Readings from Last 3 Encounters:  12/21/20 19.20 kg/m  06/11/20 17.32 kg/m  02/18/20 18.25 kg/m   Wt Readings from Last 3 Encounters:  12/21/20 105 lb (47.6 kg)  06/11/20 97 lb 12.8 oz (44.4 kg)  02/18/20 103 lb (46.7 kg)  CBC    Component Value Date/Time   WBC 7.0 09/18/2019 1208   RBC 4.23 09/18/2019 1208   HGB 12.2 09/18/2019 1208   HCT 36.2 09/18/2019 1208   PLT 328.0 09/18/2019 1208   MCV 85.7 09/18/2019 1208   MCH 28.8 11/16/2018 0844   MCHC  33.6 09/18/2019 1208   RDW 14.7 09/18/2019 1208   LYMPHSABS 1.4 09/18/2019 1208   MONOABS 0.8 09/18/2019 1208   EOSABS 0.4 09/18/2019 1208   BASOSABS 0.1 09/18/2019 1208    Chest Imaging:  CT chest October 2021: New to left lower lobe pulmonary nodules 1 at 5.1 mm it was not present before recommending repeat follow-up in April 2022. The patient's images have been independently reviewed by me.    April 2022: CT lung cancer screening lung RADS 2 Left upper lobe 7.6 mm pulmonary nodule The patient's images have been independently reviewed by me.    Pulmonary Functions Testing Results:  PFT Results Latest Ref Rng & Units 12/11/2018  FVC-Pre L 1.27  FVC-Predicted Pre % 46  FVC-Post L 1.52  FVC-Predicted Post % 56  Pre FEV1/FVC % % 38  Post FEV1/FCV % % 38  FEV1-Pre L 0.48  FEV1-Predicted Pre % 23  FEV1-Post L 0.58  DLCO uncorrected ml/min/mmHg 10.11  DLCO UNC% % 56  DLVA Predicted % 74  TLC L 4.86  TLC % Predicted % 104  RV % Predicted % 171    FeNO: None   Pathology: None   Echocardiogram: None   Heart Catheterization: None     Assessment & Plan:   Stage 4 very severe COPD by GOLD classification ([place])  Centrilobular emphysema (HCC)  Former smoker  Left upper lobe pulmonary nodule  Left lower lobe pulmonary nodule   Discussion:  This is a 70 year old former smoker severe COPD baseline, advanced emphysema currently on triple therapy inhaler regimen is azithromycin Monday Wednesday Friday  Plan: Continue Monday Wednesday Friday azithromycin 250 mg Continue Trelegy 100, once daily Refills given. Also would like samples to to help with inhaler cost. Annual low-dose lung cancer screening CT in April 2022    Current Outpatient Medications:    albuterol (VENTOLIN HFA) 108 (90 Base) MCG/ACT inhaler, Inhale 1-2 puffs into the lungs every 6 (six) hours as needed for wheezing or shortness of breath., Disp: 18 g, Rfl: 11   alendronate (FOSAMAX) 70 MG tablet,  Take 70 mg by mouth once a week., Disp: , Rfl:    ALPRAZolam (XANAX) 0.5 MG tablet, TAKE 1 & 1/2 TABLET BY MOUTH AT BEDTIME, Disp: , Rfl: 0   atorvastatin (LIPITOR) 20 MG tablet, Take 20 mg by mouth every other day., Disp: , Rfl: 2   azithromycin (ZITHROMAX) 250 MG tablet, Take 1 tablet by mouth every Monday, Wednesday and Friday, Disp: 12 each, Rfl: 4   benzonatate (TESSALON) 100 MG capsule, Take 2 capsules (200 mg total) by mouth 3 (three) times daily as needed for cough., Disp: 60 capsule, Rfl: 1   calcium carbonate (OS-CAL - DOSED IN MG OF ELEMENTAL CALCIUM) 1250 (500 Ca) MG tablet, Take 1 tablet by mouth., Disp: , Rfl:    dextromethorphan-guaiFENesin (MUCINEX DM) 30-600 MG 12hr tablet, Take 1 tablet by mouth 2 (two) times daily as needed for cough., Disp: , Rfl:    escitalopram (LEXAPRO) 10 MG tablet, Take 10 mg by mouth daily., Disp: , Rfl:    fluticasone (FLONASE) 50 MCG/ACT nasal spray, Place 1 spray into both nostrils 2 (two) times daily., Disp: , Rfl:  Fluticasone-Umeclidin-Vilant (TRELEGY ELLIPTA) 100-62.5-25 MCG/INH AEPB, Inhale 1 puff into the lungs daily., Disp: 3 each, Rfl: 3   ipratropium-albuterol (DUONEB) 0.5-2.5 (3) MG/3ML SOLN, INHALE 3 MILLILITER EVERY 6 HOURS AS NEEDED FOR COPD EXACERBATION, Disp: , Rfl:    montelukast (SINGULAIR) 10 MG tablet, TAKE 1 TABLET BY MOUTH EVERYDAY AT BEDTIME, Disp: 90 tablet, Rfl: 3   pantoprazole (PROTONIX) 40 MG tablet, Take 1 tablet by mouth once daily, Disp: 90 tablet, Rfl: 0   Garner Nash, DO [facility] 12/21/2020 1:50 PM

## 2020-12-26 ENCOUNTER — Other Ambulatory Visit: Payer: Self-pay | Admitting: Gastroenterology

## 2021-01-07 DIAGNOSIS — J449 Chronic obstructive pulmonary disease, unspecified: Secondary | ICD-10-CM | POA: Diagnosis not present

## 2021-02-01 ENCOUNTER — Other Ambulatory Visit: Payer: Self-pay | Admitting: Gastroenterology

## 2021-02-07 DIAGNOSIS — J449 Chronic obstructive pulmonary disease, unspecified: Secondary | ICD-10-CM | POA: Diagnosis not present

## 2021-02-10 DIAGNOSIS — J309 Allergic rhinitis, unspecified: Secondary | ICD-10-CM | POA: Diagnosis not present

## 2021-02-10 DIAGNOSIS — J441 Chronic obstructive pulmonary disease with (acute) exacerbation: Secondary | ICD-10-CM | POA: Diagnosis not present

## 2021-02-10 DIAGNOSIS — Z681 Body mass index (BMI) 19 or less, adult: Secondary | ICD-10-CM | POA: Diagnosis not present

## 2021-02-10 DIAGNOSIS — R0982 Postnasal drip: Secondary | ICD-10-CM | POA: Diagnosis not present

## 2021-02-24 ENCOUNTER — Telehealth: Payer: Self-pay | Admitting: Pulmonary Disease

## 2021-02-24 MED ORDER — AZITHROMYCIN 250 MG PO TABS: ORAL_TABLET | ORAL | 5 refills | Status: DC

## 2021-02-24 NOTE — Telephone Encounter (Signed)
Pt is supposed to be on the azithromycin Mon, Wed, Fri each week. Looked at pt's Rx and saw that the refills had run out. I have refilled this for pt. Called and spoke with pt letting her know this had been done and she verbalized understanding. Nothing further needed.

## 2021-03-06 ENCOUNTER — Other Ambulatory Visit: Payer: Self-pay | Admitting: Gastroenterology

## 2021-03-10 DIAGNOSIS — J449 Chronic obstructive pulmonary disease, unspecified: Secondary | ICD-10-CM | POA: Diagnosis not present

## 2021-03-12 ENCOUNTER — Other Ambulatory Visit: Payer: Self-pay | Admitting: Gastroenterology

## 2021-03-15 ENCOUNTER — Telehealth: Payer: Self-pay | Admitting: Gastroenterology

## 2021-03-15 ENCOUNTER — Other Ambulatory Visit: Payer: Self-pay | Admitting: Gastroenterology

## 2021-03-15 DIAGNOSIS — K219 Gastro-esophageal reflux disease without esophagitis: Secondary | ICD-10-CM

## 2021-03-15 NOTE — Telephone Encounter (Signed)
Patient called requesting a refill of her Protonix.  She said she had requested it through [facility] about a week ago and has been out of it for a week.  She uses the [facility] on [address] in [place].  Thank you.

## 2021-03-16 MED ORDER — PANTOPRAZOLE SODIUM 40 MG PO TBEC: DELAYED_RELEASE_TABLET | ORAL | 0 refills | Status: DC

## 2021-03-16 NOTE — Telephone Encounter (Signed)
Informed the patient that she needs to be seen by Dr. Lyndel Safe to get further refill of her protonix. Informed her that last time she was seen was on 10/2019. Appointment is made with the patient for Dr. Lyndel Safe on 04/13/21. Informed her that I will be sending her medicine for 30 days at [facility]. Patient verbalized understanding.

## 2021-03-31 DIAGNOSIS — Z681 Body mass index (BMI) 19 or less, adult: Secondary | ICD-10-CM | POA: Diagnosis not present

## 2021-03-31 DIAGNOSIS — R69 Illness, unspecified: Secondary | ICD-10-CM | POA: Diagnosis not present

## 2021-03-31 DIAGNOSIS — J441 Chronic obstructive pulmonary disease with (acute) exacerbation: Secondary | ICD-10-CM | POA: Diagnosis not present

## 2021-03-31 DIAGNOSIS — J449 Chronic obstructive pulmonary disease, unspecified: Secondary | ICD-10-CM | POA: Diagnosis not present

## 2021-04-07 DIAGNOSIS — J449 Chronic obstructive pulmonary disease, unspecified: Secondary | ICD-10-CM | POA: Diagnosis not present

## 2021-04-13 ENCOUNTER — Other Ambulatory Visit: Payer: Self-pay

## 2021-04-13 ENCOUNTER — Encounter: Payer: Self-pay | Admitting: Gastroenterology

## 2021-04-13 ENCOUNTER — Ambulatory Visit (INDEPENDENT_AMBULATORY_CARE_PROVIDER_SITE_OTHER): Payer: Medicare HMO | Admitting: Gastroenterology

## 2021-04-13 DIAGNOSIS — K219 Gastro-esophageal reflux disease without esophagitis: Secondary | ICD-10-CM

## 2021-04-13 MED ORDER — PANTOPRAZOLE SODIUM 40 MG PO TBEC: DELAYED_RELEASE_TABLET | ORAL | 4 refills | Status: DC

## 2021-04-13 NOTE — Patient Instructions (Addendum)
If you are age 72 or older, your body mass index should be between 23-30. Your Body mass index is 18.47 kg/m?Marland Kitchen If this is out of the aforementioned range listed, please consider follow up with your Primary Care Provider. ? ?If you are age 65 or younger, your body mass index should be between 19-25. Your Body mass index is 18.47 kg/m?Marland Kitchen If this is out of the aformentioned range listed, please consider follow up with your Primary Care Provider.  ? ?________________________________________________________ ? ?The [facility] providers would like to encourage you to use [facility] to communicate with providers for non-urgent requests or questions.  Due to long hold times on the telephone, sending your provider a message by [facility] may be a faster and more efficient way to get a response.  Please allow 48 business hours for a response.  Please remember that this is for non-urgent requests.  ?_______________________________________________________ ? ?We have sent the following medications to your pharmacy for you to pick up at your convenience: ?Protonix ? ?Thank you, ? ?Dr. Jackquline Denmark ? ?

## 2021-04-13 NOTE — Progress Notes (Signed)
? ? ?Chief Complaint:  ? ?Referring Provider:  Elenore Paddy, NP    ? ? ?ASSESSMENT AND PLAN;  ? ?#1. GERD with HH/eso stricture.  ? ?#2. Crohn's disease with distal TI stricture. Dx 07/2016 on colon. UGI with SB series 08/2016 with moderate to severe narrowing of TI.  Asymptomatic. Declined Biologics or any treatment.  Has been seen by Dr. Noberto Retort who also recommends watchful waiting. ? ?#4.  H/O colonic polyps 07/2016 (Bx- TA) ? ?Plan: ?-Protonix '40mg'$  po qd #90, 4RF ?-Wants to hold off on EGD/colon.  He understands risks and benefits.  She will get in touch with [place] if she has worsening of dysphagia. ?-Small but more frequent meals. ? ? ?HPI:   ? ?Amanda Cobb is a 71 y.o. female  ?Known to me from [facility] ? ?Doing much better. ? ?Here for medication refill on Protonix. ? ?Occasional dysphagia but not bad.  "Not bad enough for repeat dilation".  She has been chewing food specially steak and chicken well.  Has been drinking plenty of water. ? ?Denies having any significant diarrhea.  No significant abdominal pain but does have abdominal bloating at times which is not a new symptom. ? ?No weight loss. ? ?Wants to hold off on EGD/colonoscopy at this time. ? ?Does not want to go on Biologics. ? ?We have gone over in detail CTE results as below. ? ? ?Past GI work-up: ? ?CTE 09/24/2019-mild progression of Crohn's disease without any fistulas.  She does have a stricture as before.  No obvious obstruction at this point. ?C-reactive protein was normal ?Calprotectin was elevated ?Refusing biologicals ? ?EGD with dilatation 10/17/2019 ?- Mild Presbyesophagus. ?- Benign-appearing esophageal stenosis. Dilated. ?- 2 cm hiatal hernia. ?- Gastritis. Biopsied. ?- Non-bleeding incidental duodenal diverticulum. ? ?-EGD with dilatation 07/21/2016: Distal esophageal stricture s/p dilatation to 16.5 mm TTS balloon, small epiphrenic diverticulum, small HH, incidental duodenal diverticulum. ? ?-Colonoscopy 07/21/2016 ([facility]): 1 cm  colonic polyp s/p polypectomy (Bx-TA), erosions and small ulcer in terminal ileum. Bx-consistent with Crohn's. ? ?-Upper GI with small bowel follow-through 08/11/2016: Moderate to severe TI stricture. ? ?-Normal CBC, CMP, CRP at that time.  Positive IBD serology consistent with Crohn's.  Refused treatment.  Has quit smoking. ?Past Medical History:  ?Diagnosis Date  ? Allergic rhinitis   ? Anemia   ? Anxiety   ? COPD (chronic obstructive pulmonary disease) ([place])   ? Crohn disease ([place])   ? Esophageal dysphagia   ? Family history of colonic polyps   ? GERD (gastroesophageal reflux disease)   ? Hiatal hernia   ? History of colon polyps   ? History of Helicobacter pylori infection   ? Hyperlipidemia   ? Pituitary microadenoma ([place])   ? RLS (restless legs syndrome)   ? ? ?Past Surgical History:  ?Procedure Laterality Date  ? COLONOSCOPY  07/21/2016  ? Colonic polup status post polypectomy. Few erosions and a small ulcer in the terminal ileum ? Importance- could represent Crohn's disease (biopsied)   ? ESOPHAGOGASTRODUODENOSCOPY  07/21/2016  ? Distal esophageal stricture status post esophageal dilatation. Small epiphrenic diverticulum. Small hiatal hernia. Incidental duodenal diverticulum  ? GALLBLADDER SURGERY    ? TONSILLECTOMY    ? TUBAL LIGATION    ? ? ?Family History  ?Problem Relation Age of Onset  ? Dementia Mother   ? Colon polyps Mother   ? Asthma Father   ? COPD Sister   ? Depression Sister   ? Depression Sister   ? COPD  Sister   ? Colon cancer Neg Hx   ? Esophageal cancer Neg Hx   ? Rectal cancer Neg Hx   ? Stomach cancer Neg Hx   ? ? ?Social History  ? ?Tobacco Use  ? Smoking status: Former  ?  Packs/day: 1.00  ?  Years: 51.00  ?  Pack years: 51.00  ?  Types: Cigarettes  ?  Start date: 45  ?  Quit date: 2017  ?  Years since quitting: 6.1  ? Smokeless tobacco: Never  ?Vaping Use  ? Vaping Use: Never used  ?Substance Use Topics  ? Alcohol use: Yes  ?  Comment: Occasional, social  ? Drug use: Never  ? ? ?Current  Outpatient Medications  ?Medication Sig Dispense Refill  ? albuterol (VENTOLIN HFA) 108 (90 Base) MCG/ACT inhaler Inhale 1-2 puffs into the lungs every 6 (six) hours as needed for wheezing or shortness of breath. 18 g 11  ? alendronate (FOSAMAX) 70 MG tablet Take 70 mg by mouth once a week.    ? ALPRAZolam (XANAX) 0.5 MG tablet TAKE 1 & 1/2 TABLET BY MOUTH AT BEDTIME  0  ? atorvastatin (LIPITOR) 20 MG tablet Take 20 mg by mouth every other day.  2  ? azithromycin (ZITHROMAX) 250 MG tablet Take 1 tablet by mouth every Monday, Wednesday and Friday 48 each 5  ? calcium carbonate (OS-CAL - DOSED IN MG OF ELEMENTAL CALCIUM) 1250 (500 Ca) MG tablet Take 1 tablet by mouth.    ? dextromethorphan-guaiFENesin (MUCINEX DM) 30-600 MG 12hr tablet Take 1 tablet by mouth 2 (two) times daily as needed for cough.    ? escitalopram (LEXAPRO) 10 MG tablet Take 10 mg by mouth daily.    ? fluticasone (FLONASE) 50 MCG/ACT nasal spray Place 1 spray into both nostrils 2 (two) times daily.    ? Fluticasone-Umeclidin-Vilant (TRELEGY ELLIPTA) 100-62.5-25 MCG/INH AEPB Inhale 1 puff into the lungs daily. 3 each 3  ? ipratropium-albuterol (DUONEB) 0.5-2.5 (3) MG/3ML SOLN INHALE 3 MILLILITER EVERY 6 HOURS AS NEEDED FOR COPD EXACERBATION    ? montelukast (SINGULAIR) 10 MG tablet TAKE 1 TABLET BY MOUTH EVERYDAY AT BEDTIME 90 tablet 3  ? pantoprazole (PROTONIX) 40 MG tablet TAKE 1 TABLET BY MOUTH ONCE DAILY . Appointment pending 30 tablet 0  ? ?No current facility-administered medications for this visit.  ? ? ?Allergies  ?Allergen Reactions  ? Codeine Nausea And Vomiting  ? ? ?Review of Systems:  ?Psychiatric/Behavioral: Has anxiety or depression ? ?  ? ?Physical Exam:   ? ?BP 110/80   Pulse 90   Ht '5\' 2"'$  (1.575 m)   Wt 101 lb (45.8 kg)   SpO2 95%   BMI 18.47 kg/m?  ?Wt Readings from Last 3 Encounters:  ?04/13/21 101 lb (45.8 kg)  ?12/21/20 105 lb (47.6 kg)  ?06/11/20 97 lb 12.8 oz (44.4 kg)  ? ?Constitutional:  Well-developed, in no acute  distress. ?Psychiatric: Normal mood and affect. Behavior is normal. ?HEENT: Pupils normal.  Conjunctivae are normal. No scleral icterus. ?Neck supple.  ?Cardiovascular: Normal rate, regular rhythm. No edema ?Pulmonary/chest: Bilateral decreased breath sounds ?Abdominal: Soft, nondistended. Nontender. Bowel sounds active throughout. There are no masses palpable. No hepatomegaly. ?Rectal:  defered ?Neurological: Alert and oriented to person place and time. ?Skin: Skin is warm and dry. No rashes noted. ? ?Data Reviewed: I have personally reviewed following labs and imaging studies ? ?CBC: ?CBC Latest Ref Rng & Units 09/18/2019 11/16/2018  ?WBC 4.0 - 10.5 K/uL 7.0  9.1  ?Hemoglobin 12.0 - 15.0 g/dL 12.2 12.8  ?Hematocrit 36.0 - 46.0 % 36.2 39.5  ?Platelets 150.0 - 400.0 K/uL 328.0 411(H)  ? ? ?CMP: ?CMP Latest Ref Rng & Units 09/18/2019 02/14/2019 11/16/2018  ?Glucose 70 - 99 mg/dL 86 106(H) 119(H)  ?BUN 6 - 23 mg/dL 15 15 5(L)  ?Creatinine 0.40 - 1.20 mg/dL 0.58 0.58 0.63  ?Sodium 135 - 145 mEq/L 136 139 139  ?Potassium 3.5 - 5.1 mEq/L 4.4 3.1(L) 3.3(L)  ?Chloride 96 - 112 mEq/L 101 103 100  ?CO2 19 - 32 mEq/L '28 29 27  '$ ?Calcium 8.4 - 10.5 mg/dL 9.3 9.0 9.5  ?Total Protein 6.0 - 8.3 g/dL 7.1 - 7.3  ?Total Bilirubin 0.2 - 1.2 mg/dL 0.4 - 0.4  ?Alkaline Phos 39 - 117 U/L 73 - 76  ?AST 0 - 37 U/L 20 - 23  ?ALT 0 - 35 U/L 17 - 19  ? ? ? ? ?Carmell Austria, MD 04/13/2021, 2:09 PM ? ?Cc: Elenore Paddy, NP ? ? ?

## 2021-05-08 DIAGNOSIS — J449 Chronic obstructive pulmonary disease, unspecified: Secondary | ICD-10-CM | POA: Diagnosis not present

## 2021-05-20 ENCOUNTER — Ambulatory Visit (INDEPENDENT_AMBULATORY_CARE_PROVIDER_SITE_OTHER)
Admission: RE | Admit: 2021-05-20 | Discharge: 2021-05-20 | Disposition: A | Discharge status: Home / Self Care | Payer: Medicare HMO | Source: Ambulatory Visit | Attending: Acute Care | Admitting: Acute Care

## 2021-05-20 DIAGNOSIS — Z87891 Personal history of nicotine dependence: Secondary | ICD-10-CM | POA: Diagnosis not present

## 2021-05-24 ENCOUNTER — Other Ambulatory Visit: Payer: Self-pay | Admitting: Acute Care

## 2021-05-24 DIAGNOSIS — Z122 Encounter for screening for malignant neoplasm of respiratory organs: Secondary | ICD-10-CM

## 2021-05-24 DIAGNOSIS — Z87891 Personal history of nicotine dependence: Secondary | ICD-10-CM

## 2021-06-05 IMAGING — CT CT CHEST LCS NODULE FOLLOW-UP W/O CM
1 of 4 series · 10 of 40 positions shown, 13 images · non-contrast
Comparison: 02/19/2018.

CLINICAL DATA: Former smoker, quit 4 years ago, 51 pack-year
history.

EXAM:
CT CHEST WITHOUT CONTRAST FOR LUNG CANCER SCREENING NODULE FOLLOW-UP
TECHNIQUE: Multidetector CT imaging of the chest was performed following the
standard protocol without IV contrast.

[ct lung segmentation data · axial · 0.60mm/px · z∈[-346,-346]mm · 10 of 306 frames shown]
[frame 1/306  mediastinal]
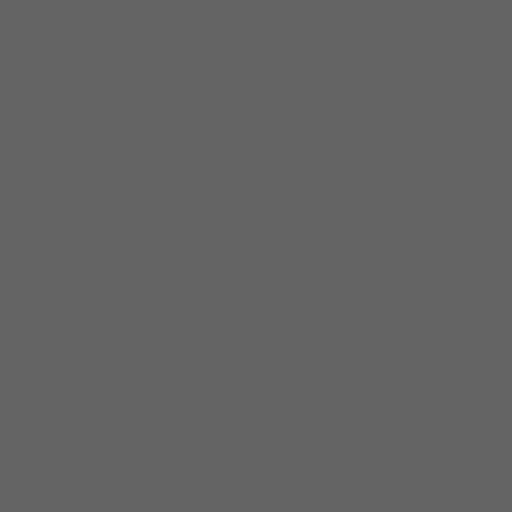
[frame 1/306  lung]
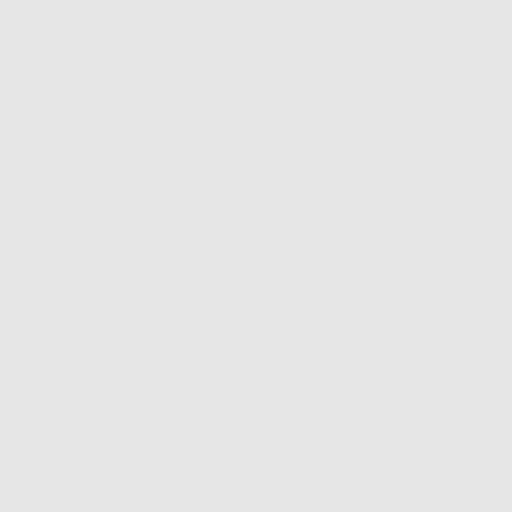
[frame 34/306  lung]
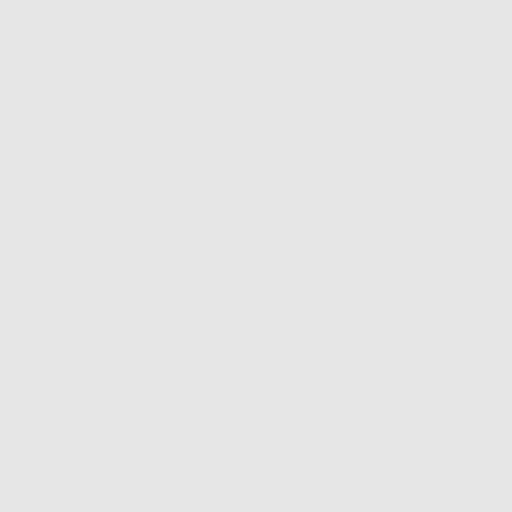
[frame 68/306  lung]
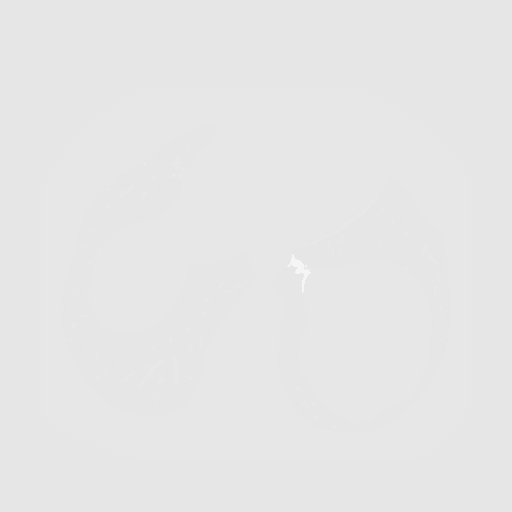
[frame 102/306  lung]
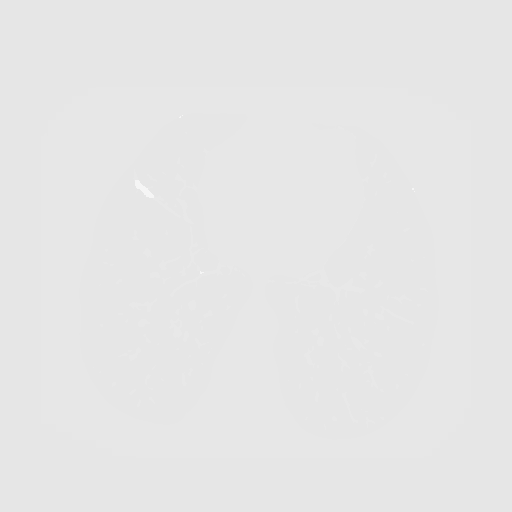
[frame 136/306  mediastinal]
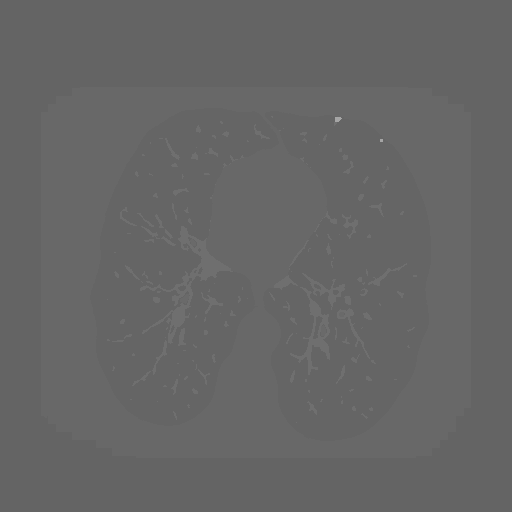
[frame 136/306  lung]
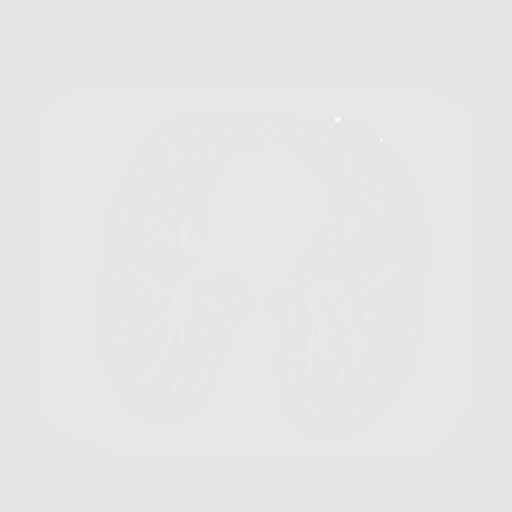
[frame 170/306  lung]
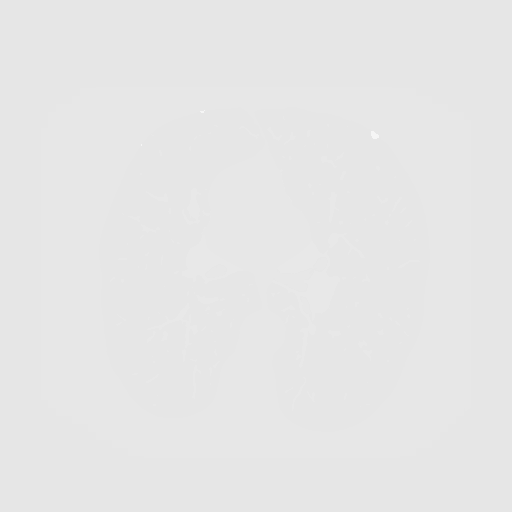
[frame 204/306  lung]
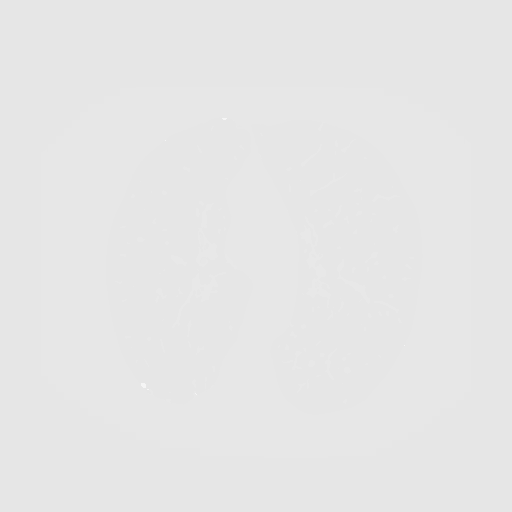
[frame 238/306  lung]
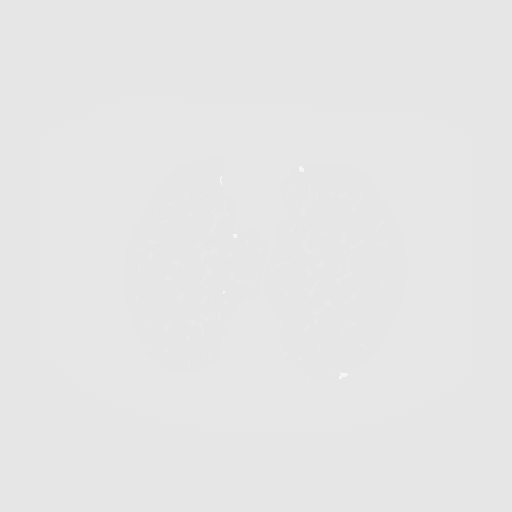
[frame 272/306  mediastinal]
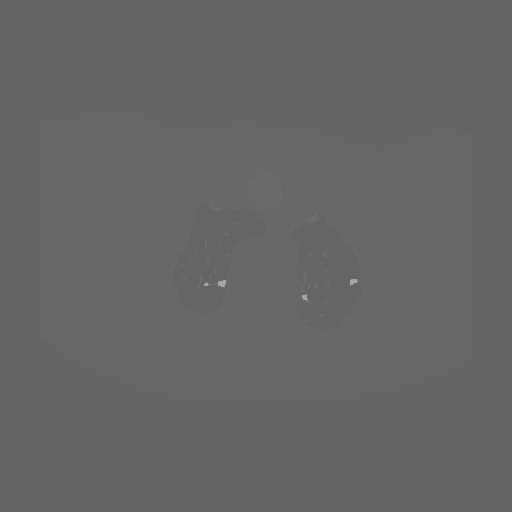
[frame 272/306  lung]
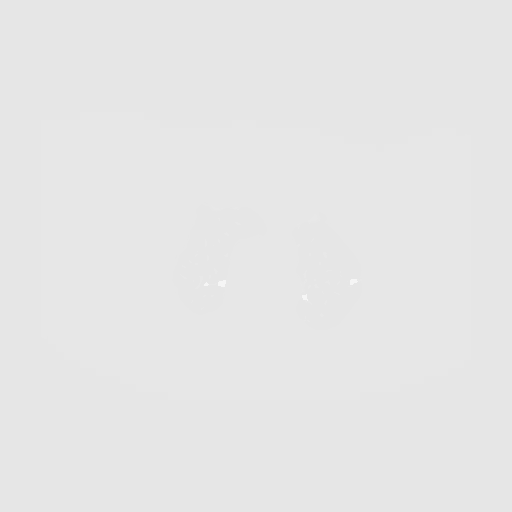
[frame 306/306  lung]
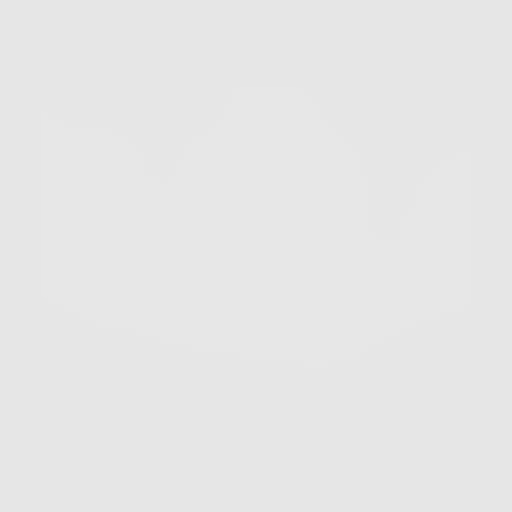

[10 of 40 positions shown; findings below may reference images not displayed]

FINDINGS: Cardiovascular: Atherosclerotic calcification of the aorta and
coronary arteries. Heart size normal. No pericardial effusion.

Mediastinum/Nodes: No pathologically enlarged mediastinal or
axillary lymph nodes. Hilar regions are difficult to evaluate
without IV contrast but appear grossly unremarkable. Esophagus is
grossly unremarkable.

Lungs/Pleura: Biapical pleuroparenchymal scarring. Moderate
centrilobular emphysema. Bronchiectasis and volume loss in the right
middle lobe. There is a new irregular nodular density in the apical
left upper lobe measuring 8.8 mm (3/52). A new subpleural nodule in
the apical left upper lobe (3/34) measures 6.3 mm. Finally, a 5.2 mm
in the posterior left upper lobe (3/100) is also new. Additional
pulmonary nodules measure 7.2 mm or less in size and are stable. No
pleural fluid. Airway is unremarkable.

Upper Abdomen: Visualized portions of the liver and adrenal glands
are unremarkable. There may be a punctate stone in the upper pole
right kidney. Hyperdense lesion off the upper pole left kidney
measures 8 mm and is difficult to further characterize due to size
and lack of postcontrast imaging but appears stable in size.
Visualized portions of the spleen, pancreas, stomach and bowel are
unremarkable. No upper abdominal adenopathy.

Musculoskeletal: No worrisome lytic or sclerotic lesions.
IMPRESSION: 1. New left upper lobe nodules measure up to 8.8 mm. Lung-RADS 4B,
suspicious. Additional imaging evaluation or consultation with
Pulmonology or Thoracic Surgery recommended. These results will be
called to the ordering clinician or representative by the
Radiologist Assistant, and communication documented in the PACS or
zVision Dashboard.
2. Possible punctate right renal stone.
3. Aortic atherosclerosis (JG0FZ-170.0). Coronary artery
calcification.
4.  Emphysema ([id number]).

## 2021-06-07 DIAGNOSIS — J449 Chronic obstructive pulmonary disease, unspecified: Secondary | ICD-10-CM | POA: Diagnosis not present

## 2021-06-28 ENCOUNTER — Encounter: Payer: Self-pay | Admitting: Pulmonary Disease

## 2021-06-28 ENCOUNTER — Ambulatory Visit: Payer: Medicare HMO | Admitting: Pulmonary Disease

## 2021-06-28 VITALS — BP 110/62 | HR 69 | Temp 98.0°F | Ht 63.0 in | Wt 104.8 lb

## 2021-06-28 DIAGNOSIS — R911 Solitary pulmonary nodule: Secondary | ICD-10-CM

## 2021-06-28 DIAGNOSIS — J449 Chronic obstructive pulmonary disease, unspecified: Secondary | ICD-10-CM

## 2021-06-28 DIAGNOSIS — J432 Centrilobular emphysema: Secondary | ICD-10-CM

## 2021-06-28 DIAGNOSIS — Z87891 Personal history of nicotine dependence: Secondary | ICD-10-CM

## 2021-06-28 NOTE — Progress Notes (Signed)
Synopsis: Referred in Novermber 2019 for COPD.  Subjective:   PATIENT ID: Amanda Cobb GENDER: female DOB: Mar 12, 1950, MRN: [id number]  Chief Complaint  Patient presents with   Follow-up    Follow up. Patient has no complaints.     OV 12/2017: PMH of GERD and COPD. She was diagnosed with COPD by office spiro about 6 years ago. Quit smoking 3 years ago, smoked from age 71 to 56, about 1 ppd for 50 years.  Overall has been doing well but does notice cough on occasion.  She does have some shortness of breath.  Her cough is productive.  She denies fevers denies hemoptysis.  Her cough at times is strong enough to make her vomit.  She does have significant coughing spells worse in the mornings.  She is currently managed using a Trelegy inhaler.  She is not really sure that this made a whole lot of difference.  She used to be on Symbicort (she believes) in the past and she thought that this helped.  OV 12/20/2018: Virtual Visit via Telephone Note Patient with COPD.  Increased cough sputum production shortness of breath and wheezing at home. Observations/Objective: Patient able to speak in complete sentences. Assessment and Plan: Acute exacerbation of COPD -Start prednisone taper -Z-Pak -Refill Trelegy  OV 02/18/2020: Here today for follow-up regarding COPD.  Having ongoing bouts of cough and shortness of breath.  Daily sputum production.  Recent exacerbation requiring antibiotics.  She also felt to have a urinary tract infection.  Doing better at this time but still has cough and dyspnea on exertion.  She tries to walk on her treadmill but has had difficulty with this as well.  Taking her Trelegy regularly.  Patient had new lung nodule on recent CT imaging in October.  Follow-up scheduled for 6 months in April 2022.  OV 06/11/2020: Here to day for follow up regarding copd and nodules.  Here today doing well.  Started on Trelegy as well as azithromycin Monday Wednesday Friday.  Her shortness of  breath and exertional dyspnea have almost resolved.  She is able to keep up with her multiple grandchildren and great-grandchildren.  Very happy about her stability at this time.  She had recent lung cancer screening CT that shows a left upper lobe pulmonary nodule that 7.6 mm stable in size from the previous 24-monthfollow-up.  She is enrolled in our lung cancer screening program and will have a 1 year CT scan already ordered for her.  OV 12/21/2020: still having a few coughing spells, dry and non-productive. No recent exacerbations.  Doing well on her current regimen and.  She had a slight exacerbation possibly in September it sounds like.  She slowly been recovering with a lingering cough.  Using her Trelegy plus as needed albuterol.  As well as azithromycin Monday Wednesday Friday.  OV 06/28/2021: Patient here today for follow-up for her COPD.  She has been doing really well on her new regimen of Trelegy.  Albuterol and azithromycin Monday Wednesday Friday.  She has had 1 exacerbation this past year.  From respiratory standpoint she is doing well able to complete all of her activities of daily living.  She saw SJudson Rocha couple months ago with an exacerbation was treated with prednisone.  She feels much better after this.    Past Medical History:  Diagnosis Date   Allergic rhinitis    Anemia    Anxiety    COPD (chronic obstructive pulmonary disease) ([place]    Crohn  disease ([place])    Esophageal dysphagia    Family history of colonic polyps    GERD (gastroesophageal reflux disease)    Hiatal hernia    History of colon polyps    History of Helicobacter pylori infection    Hyperlipidemia    Pituitary microadenoma (H[facility])    RLS (restless legs syndrome)      Family History  Problem Relation Age of Onset   Dementia Mother    Colon polyps Mother    Asthma Father    COPD Sister    Depression Sister    Depression Sister    COPD Sister    Colon cancer Neg Hx    Esophageal cancer Neg Hx     Rectal cancer Neg Hx    Stomach cancer Neg Hx       Past Surgical History:  Procedure Laterality Date   COLONOSCOPY  07/21/2016   Colonic polup status post polypectomy. Few erosions and a small ulcer in the terminal ileum ? Importance- could represent Crohn's disease (biopsied)    ESOPHAGOGASTRODUODENOSCOPY  07/21/2016   Distal esophageal stricture status post esophageal dilatation. Small epiphrenic diverticulum. Small hiatal hernia. Incidental duodenal diverticulum   GALLBLADDER SURGERY     TONSILLECTOMY     TUBAL LIGATION      Social History   Socioeconomic History   Marital status: Married    Spouse name: Not on file   Number of children: Not on file   Years of education: Not on file   Highest education level: Not on file  Occupational History   Not on file  Tobacco Use   Smoking status: Former    Packs/day: 1.00    Years: 51.00    Pack years: 51.00    Types: Cigarettes    Start date: 2    Quit date: 2017    Years since quitting: 6.3   Smokeless tobacco: Never  Vaping Use   Vaping Use: Never used  Substance and Sexual Activity   Alcohol use: Yes    Comment: Occasional, social   Drug use: Never   Sexual activity: Not on file  Other Topics Concern   Not on file  Social History Narrative   Not on file   Social Determinants of Health   Financial Resource Strain: Not on file  Food Insecurity: Not on file  Transportation Needs: Not on file  Physical Activity: Not on file  Stress: Not on file  Social Connections: Not on file  Intimate Partner Violence: Not on file     Allergies  Allergen Reactions   Codeine Nausea And Vomiting     Outpatient Medications Prior to Visit  Medication Sig Dispense Refill   albuterol (VENTOLIN HFA) 108 (90 Base) MCG/ACT inhaler Inhale 1-2 puffs into the lungs every 6 (six) hours as needed for wheezing or shortness of breath. 18 g 11   alendronate (FOSAMAX) 70 MG tablet Take 70 mg by mouth once a week.     ALPRAZolam (XANAX)  0.5 MG tablet TAKE 1 & 1/2 TABLET BY MOUTH AT BEDTIME  0   atorvastatin (LIPITOR) 20 MG tablet Take 20 mg by mouth every other day.  2   azithromycin (ZITHROMAX) 250 MG tablet Take 1 tablet by mouth every Monday, Wednesday and Friday 48 each 5   calcium carbonate (OS-CAL - DOSED IN MG OF ELEMENTAL CALCIUM) 1250 (500 Ca) MG tablet Take 1 tablet by mouth.     dextromethorphan-guaiFENesin (MUCINEX DM) 30-600 MG 12hr tablet Take 1 tablet by  mouth 2 (two) times daily as needed for cough.     escitalopram (LEXAPRO) 10 MG tablet Take 10 mg by mouth daily.     fluticasone (FLONASE) 50 MCG/ACT nasal spray Place 1 spray into both nostrils 2 (two) times daily.     Fluticasone-Umeclidin-Vilant (TRELEGY ELLIPTA) 100-62.5-25 MCG/INH AEPB Inhale 1 puff into the lungs daily. 3 each 3   ipratropium-albuterol (DUONEB) 0.5-2.5 (3) MG/3ML SOLN INHALE 3 MILLILITER EVERY 6 HOURS AS NEEDED FOR COPD EXACERBATION     montelukast (SINGULAIR) 10 MG tablet TAKE 1 TABLET BY MOUTH EVERYDAY AT BEDTIME 90 tablet 3   pantoprazole (PROTONIX) 40 MG tablet TAKE 1 TABLET BY MOUTH ONCE DAILY 90 tablet 4   No facility-administered medications prior to visit.    Review of Systems  Constitutional:  Negative for chills, fever, malaise/fatigue and weight loss.  HENT:  Negative for hearing loss, sore throat and tinnitus.   Eyes:  Negative for blurred vision and double vision.  Respiratory:  Negative for cough, hemoptysis, sputum production, shortness of breath, wheezing and stridor.   Cardiovascular:  Negative for chest pain, palpitations, orthopnea, leg swelling and PND.  Gastrointestinal:  Negative for abdominal pain, constipation, diarrhea, heartburn, nausea and vomiting.  Genitourinary:  Negative for dysuria, hematuria and urgency.  Musculoskeletal:  Negative for joint pain and myalgias.  Skin:  Negative for itching and rash.  Neurological:  Negative for dizziness, tingling, weakness and headaches.  Endo/Heme/Allergies:  Negative  for environmental allergies. Does not bruise/bleed easily.  Psychiatric/Behavioral:  Negative for depression. The patient is not nervous/anxious and does not have insomnia.   All other systems reviewed and are negative.   Objective:  Physical Exam Vitals reviewed.  Constitutional:      General: She is not in acute distress.    Appearance: She is well-developed.     Comments: Thin  HENT:     Head: Normocephalic and atraumatic.     Mouth/Throat:     Pharynx: No oropharyngeal exudate.  Eyes:     Conjunctiva/sclera: Conjunctivae normal.     Pupils: Pupils are equal, round, and reactive to light.  Neck:     Vascular: No JVD.     Trachea: No tracheal deviation.     Comments: Loss of supraclavicular fat Cardiovascular:     Rate and Rhythm: Normal rate and regular rhythm.     Heart sounds: S1 normal and S2 normal.     Comments: Distant heart tones Pulmonary:     Effort: No tachypnea or accessory muscle usage.     Breath sounds: No stridor. Decreased breath sounds (throughout all lung fields) present. No wheezing, rhonchi or rales.  Abdominal:     Palpations: Abdomen is soft.     Tenderness: There is no abdominal tenderness.  Musculoskeletal:        General: No deformity (muscle wasting ).  Skin:    General: Skin is warm and dry.     Capillary Refill: Capillary refill takes less than 2 seconds.     Findings: No rash.  Neurological:     Mental Status: She is alert and oriented to person, place, and time.  Psychiatric:        Behavior: Behavior normal.     Vitals:   06/28/21 1430  BP: 110/62  Pulse: 69  Temp: 98 F (36.7 C)  TempSrc: Oral  SpO2: 94%  Weight: 104 lb 12.8 oz (47.5 kg)  Height: '5\' 3"'$  (1.6 m)   94% on RA BMI Readings from Last 3  Encounters:  06/28/21 18.56 kg/m  04/13/21 18.47 kg/m  12/21/20 19.20 kg/m   Wt Readings from Last 3 Encounters:  06/28/21 104 lb 12.8 oz (47.5 kg)  04/13/21 101 lb (45.8 kg)  12/21/20 105 lb (47.6 kg)     CBC     Component Value Date/Time   WBC 7.0 09/18/2019 1208   RBC 4.23 09/18/2019 1208   HGB 12.2 09/18/2019 1208   HCT 36.2 09/18/2019 1208   PLT 328.0 09/18/2019 1208   MCV 85.7 09/18/2019 1208   MCH 28.8 11/16/2018 0844   MCHC 33.6 09/18/2019 1208   RDW 14.7 09/18/2019 1208   LYMPHSABS 1.4 09/18/2019 1208   MONOABS 0.8 09/18/2019 1208   EOSABS 0.4 09/18/2019 1208   BASOSABS 0.1 09/18/2019 1208    Chest Imaging:  CT chest October 2021: New to left lower lobe pulmonary nodules 1 at 5.1 mm it was not present before recommending repeat follow-up in April 2022. The patient's images have been independently reviewed by me.    April 2022: CT lung cancer screening lung RADS 2 Left upper lobe 7.6 mm pulmonary nodule The patient's images have been independently reviewed by me.    Pulmonary Functions Testing Results:     Latest Ref Rng & Units 12/11/2018   12:05 PM  PFT Results  FVC-Pre L 1.27    FVC-Predicted Pre % 46    FVC-Post L 1.52    FVC-Predicted Post % 56    Pre FEV1/FVC % % 38    Post FEV1/FCV % % 38    FEV1-Pre L 0.48    FEV1-Predicted Pre % 23    FEV1-Post L 0.58    DLCO uncorrected ml/min/mmHg 10.11    DLCO UNC% % 56    DLVA Predicted % 74    TLC L 4.86    TLC % Predicted % 104    RV % Predicted % 171      FeNO: None   Pathology: None   Echocardiogram: None   Heart Catheterization: None     Assessment & Plan:   Stage 4 very severe COPD by GOLD classification ([place])  Centrilobular emphysema (HCC)  Former smoker  Left upper lobe pulmonary nodule  Left lower lobe pulmonary nodule   Discussion:  This is a 71 year old former smoker severe COPD baseline advanced centrilobular emphysema, currently on triple therapy inhaler regimen plus azithromycin Monday Wednesday Friday and as needed albuterol  Plan: Continue azithromycin Monday Wednesday Friday 250 mg Continue Trelegy 100 once daily Continue refills for these as needed. Continue low-dose lung  cancer screening CT next 1 to be repeated in April 2023.  Patient to follow-up with [place] in 1 year or as needed.    Current Outpatient Medications:    albuterol (VENTOLIN HFA) 108 (90 Base) MCG/ACT inhaler, Inhale 1-2 puffs into the lungs every 6 (six) hours as needed for wheezing or shortness of breath., Disp: 18 g, Rfl: 11   alendronate (FOSAMAX) 70 MG tablet, Take 70 mg by mouth once a week., Disp: , Rfl:    ALPRAZolam (XANAX) 0.5 MG tablet, TAKE 1 & 1/2 TABLET BY MOUTH AT BEDTIME, Disp: , Rfl: 0   atorvastatin (LIPITOR) 20 MG tablet, Take 20 mg by mouth every other day., Disp: , Rfl: 2   azithromycin (ZITHROMAX) 250 MG tablet, Take 1 tablet by mouth every Monday, Wednesday and Friday, Disp: 48 each, Rfl: 5   calcium carbonate (OS-CAL - DOSED IN MG OF ELEMENTAL CALCIUM) 1250 (500 Ca) MG tablet, Take 1  tablet by mouth., Disp: , Rfl:    dextromethorphan-guaiFENesin (MUCINEX DM) 30-600 MG 12hr tablet, Take 1 tablet by mouth 2 (two) times daily as needed for cough., Disp: , Rfl:    escitalopram (LEXAPRO) 10 MG tablet, Take 10 mg by mouth daily., Disp: , Rfl:    fluticasone (FLONASE) 50 MCG/ACT nasal spray, Place 1 spray into both nostrils 2 (two) times daily., Disp: , Rfl:    Fluticasone-Umeclidin-Vilant (TRELEGY ELLIPTA) 100-62.5-25 MCG/INH AEPB, Inhale 1 puff into the lungs daily., Disp: 3 each, Rfl: 3   ipratropium-albuterol (DUONEB) 0.5-2.5 (3) MG/3ML SOLN, INHALE 3 MILLILITER EVERY 6 HOURS AS NEEDED FOR COPD EXACERBATION, Disp: , Rfl:    montelukast (SINGULAIR) 10 MG tablet, TAKE 1 TABLET BY MOUTH EVERYDAY AT BEDTIME, Disp: 90 tablet, Rfl: 3   pantoprazole (PROTONIX) 40 MG tablet, TAKE 1 TABLET BY MOUTH ONCE DAILY, Disp: 90 tablet, Rfl: 4   Garner Nash, DO [facility] 06/28/2021 2:55 PM

## 2021-06-28 NOTE — Patient Instructions (Addendum)
Thank you for visiting Dr. Valeta Harms at [facility]. Today we recommend the following:  Continue trelegy and azithromycin MWF   Return in about 1 year (around 06/29/2022) for with Eric Form, NP, or Dr. Valeta Harms.  Repeat LDCT in 1 year    Please do your part to reduce the spread of COVID-19.

## 2021-07-08 DIAGNOSIS — J449 Chronic obstructive pulmonary disease, unspecified: Secondary | ICD-10-CM | POA: Diagnosis not present

## 2021-08-05 DIAGNOSIS — L728 Other follicular cysts of the skin and subcutaneous tissue: Secondary | ICD-10-CM | POA: Diagnosis not present

## 2021-08-05 DIAGNOSIS — L820 Inflamed seborrheic keratosis: Secondary | ICD-10-CM | POA: Diagnosis not present

## 2021-08-07 DIAGNOSIS — J449 Chronic obstructive pulmonary disease, unspecified: Secondary | ICD-10-CM | POA: Diagnosis not present

## 2021-08-12 DIAGNOSIS — H52223 Regular astigmatism, bilateral: Secondary | ICD-10-CM | POA: Diagnosis not present

## 2021-08-12 DIAGNOSIS — H524 Presbyopia: Secondary | ICD-10-CM | POA: Diagnosis not present

## 2021-09-07 DIAGNOSIS — J449 Chronic obstructive pulmonary disease, unspecified: Secondary | ICD-10-CM | POA: Diagnosis not present

## 2021-09-08 DIAGNOSIS — N3946 Mixed incontinence: Secondary | ICD-10-CM | POA: Diagnosis not present

## 2021-09-08 DIAGNOSIS — R300 Dysuria: Secondary | ICD-10-CM | POA: Diagnosis not present

## 2021-09-08 DIAGNOSIS — Z681 Body mass index (BMI) 19 or less, adult: Secondary | ICD-10-CM | POA: Diagnosis not present

## 2021-09-08 DIAGNOSIS — J449 Chronic obstructive pulmonary disease, unspecified: Secondary | ICD-10-CM | POA: Diagnosis not present

## 2021-10-01 DIAGNOSIS — Z1231 Encounter for screening mammogram for malignant neoplasm of breast: Secondary | ICD-10-CM | POA: Diagnosis not present

## 2021-10-08 DIAGNOSIS — J449 Chronic obstructive pulmonary disease, unspecified: Secondary | ICD-10-CM | POA: Diagnosis not present

## 2021-10-15 DIAGNOSIS — R69 Illness, unspecified: Secondary | ICD-10-CM | POA: Diagnosis not present

## 2021-10-15 DIAGNOSIS — J432 Centrilobular emphysema: Secondary | ICD-10-CM | POA: Diagnosis not present

## 2021-10-15 DIAGNOSIS — R0982 Postnasal drip: Secondary | ICD-10-CM | POA: Diagnosis not present

## 2021-10-15 DIAGNOSIS — E559 Vitamin D deficiency, unspecified: Secondary | ICD-10-CM | POA: Diagnosis not present

## 2021-10-15 DIAGNOSIS — E782 Mixed hyperlipidemia: Secondary | ICD-10-CM | POA: Diagnosis not present

## 2021-10-15 DIAGNOSIS — I70219 Atherosclerosis of native arteries of extremities with intermittent claudication, unspecified extremity: Secondary | ICD-10-CM | POA: Diagnosis not present

## 2021-10-15 DIAGNOSIS — Z0000 Encounter for general adult medical examination without abnormal findings: Secondary | ICD-10-CM | POA: Diagnosis not present

## 2021-10-15 DIAGNOSIS — M810 Age-related osteoporosis without current pathological fracture: Secondary | ICD-10-CM | POA: Diagnosis not present

## 2021-10-15 DIAGNOSIS — I700 Atherosclerosis of aorta: Secondary | ICD-10-CM | POA: Diagnosis not present

## 2021-10-15 DIAGNOSIS — Z79899 Other long term (current) drug therapy: Secondary | ICD-10-CM | POA: Diagnosis not present

## 2021-10-15 DIAGNOSIS — J309 Allergic rhinitis, unspecified: Secondary | ICD-10-CM | POA: Diagnosis not present

## 2021-10-15 DIAGNOSIS — J449 Chronic obstructive pulmonary disease, unspecified: Secondary | ICD-10-CM | POA: Diagnosis not present

## 2021-11-07 DIAGNOSIS — J449 Chronic obstructive pulmonary disease, unspecified: Secondary | ICD-10-CM | POA: Diagnosis not present

## 2021-12-08 DIAGNOSIS — J449 Chronic obstructive pulmonary disease, unspecified: Secondary | ICD-10-CM | POA: Diagnosis not present

## 2022-01-07 DIAGNOSIS — J449 Chronic obstructive pulmonary disease, unspecified: Secondary | ICD-10-CM | POA: Diagnosis not present

## 2022-01-16 DIAGNOSIS — S93401A Sprain of unspecified ligament of right ankle, initial encounter: Secondary | ICD-10-CM | POA: Diagnosis not present

## 2022-01-16 DIAGNOSIS — M25571 Pain in right ankle and joints of right foot: Secondary | ICD-10-CM | POA: Diagnosis not present

## 2022-02-05 DIAGNOSIS — R0981 Nasal congestion: Secondary | ICD-10-CM | POA: Diagnosis not present

## 2022-02-05 DIAGNOSIS — R051 Acute cough: Secondary | ICD-10-CM | POA: Diagnosis not present

## 2022-02-05 DIAGNOSIS — J449 Chronic obstructive pulmonary disease, unspecified: Secondary | ICD-10-CM | POA: Diagnosis not present

## 2022-02-07 DIAGNOSIS — J449 Chronic obstructive pulmonary disease, unspecified: Secondary | ICD-10-CM | POA: Diagnosis not present

## 2022-03-10 DIAGNOSIS — J449 Chronic obstructive pulmonary disease, unspecified: Secondary | ICD-10-CM | POA: Diagnosis not present

## 2022-03-26 ENCOUNTER — Other Ambulatory Visit: Payer: Self-pay | Admitting: Pulmonary Disease

## 2022-04-01 DIAGNOSIS — J329 Chronic sinusitis, unspecified: Secondary | ICD-10-CM | POA: Diagnosis not present

## 2022-04-01 DIAGNOSIS — J40 Bronchitis, not specified as acute or chronic: Secondary | ICD-10-CM | POA: Diagnosis not present

## 2022-04-08 DIAGNOSIS — J449 Chronic obstructive pulmonary disease, unspecified: Secondary | ICD-10-CM | POA: Diagnosis not present

## 2022-05-09 DIAGNOSIS — J449 Chronic obstructive pulmonary disease, unspecified: Secondary | ICD-10-CM | POA: Diagnosis not present

## 2022-05-24 ENCOUNTER — Ambulatory Visit
Admission: RE | Admit: 2022-05-24 | Discharge: 2022-05-24 | Disposition: A | Discharge status: Home / Self Care | Payer: Medicare HMO | Source: Ambulatory Visit | Attending: Nurse Practitioner | Admitting: Nurse Practitioner

## 2022-05-24 DIAGNOSIS — I2510 Atherosclerotic heart disease of native coronary artery without angina pectoris: Secondary | ICD-10-CM | POA: Diagnosis not present

## 2022-05-24 DIAGNOSIS — I700 Atherosclerosis of aorta: Secondary | ICD-10-CM | POA: Diagnosis not present

## 2022-05-24 DIAGNOSIS — Z87891 Personal history of nicotine dependence: Secondary | ICD-10-CM

## 2022-05-24 DIAGNOSIS — J432 Centrilobular emphysema: Secondary | ICD-10-CM | POA: Diagnosis not present

## 2022-05-24 DIAGNOSIS — Z122 Encounter for screening for malignant neoplasm of respiratory organs: Secondary | ICD-10-CM

## 2022-05-26 DIAGNOSIS — H52223 Regular astigmatism, bilateral: Secondary | ICD-10-CM | POA: Diagnosis not present

## 2022-05-26 DIAGNOSIS — H18712 Corneal ectasia, left eye: Secondary | ICD-10-CM | POA: Diagnosis not present

## 2022-05-26 DIAGNOSIS — H524 Presbyopia: Secondary | ICD-10-CM | POA: Diagnosis not present

## 2022-05-26 DIAGNOSIS — H2513 Age-related nuclear cataract, bilateral: Secondary | ICD-10-CM | POA: Diagnosis not present

## 2022-05-26 DIAGNOSIS — H5203 Hypermetropia, bilateral: Secondary | ICD-10-CM | POA: Diagnosis not present

## 2022-05-26 DIAGNOSIS — H18603 Keratoconus, unspecified, bilateral: Secondary | ICD-10-CM | POA: Diagnosis not present

## 2022-05-27 ENCOUNTER — Telehealth: Payer: Self-pay | Admitting: Acute Care

## 2022-05-27 NOTE — Telephone Encounter (Signed)
Called and spoke with pt and let her know Dr Tonia Brooms has reviewed her lung screening CT and will discuss with her at her upcoming appt with him on 06/17/22. Pt verbalized understanding. Nothing further needed at this time.

## 2022-05-27 NOTE — Telephone Encounter (Signed)
Call report from [facility]. Please call @ [phone]

## 2022-05-27 NOTE — Telephone Encounter (Signed)
Call report for pt from [facility] imaging. Sent to Dr Tonia Brooms.

## 2022-06-08 DIAGNOSIS — F411 Generalized anxiety disorder: Secondary | ICD-10-CM | POA: Diagnosis not present

## 2022-06-08 DIAGNOSIS — J432 Centrilobular emphysema: Secondary | ICD-10-CM | POA: Diagnosis not present

## 2022-06-08 DIAGNOSIS — F17201 Nicotine dependence, unspecified, in remission: Secondary | ICD-10-CM | POA: Diagnosis not present

## 2022-06-08 DIAGNOSIS — E782 Mixed hyperlipidemia: Secondary | ICD-10-CM | POA: Diagnosis not present

## 2022-06-08 DIAGNOSIS — R0982 Postnasal drip: Secondary | ICD-10-CM | POA: Diagnosis not present

## 2022-06-08 DIAGNOSIS — Z1331 Encounter for screening for depression: Secondary | ICD-10-CM | POA: Diagnosis not present

## 2022-06-08 DIAGNOSIS — J309 Allergic rhinitis, unspecified: Secondary | ICD-10-CM | POA: Diagnosis not present

## 2022-06-08 DIAGNOSIS — I700 Atherosclerosis of aorta: Secondary | ICD-10-CM | POA: Diagnosis not present

## 2022-06-08 DIAGNOSIS — K219 Gastro-esophageal reflux disease without esophagitis: Secondary | ICD-10-CM | POA: Diagnosis not present

## 2022-06-08 DIAGNOSIS — J449 Chronic obstructive pulmonary disease, unspecified: Secondary | ICD-10-CM | POA: Diagnosis not present

## 2022-06-08 DIAGNOSIS — Z681 Body mass index (BMI) 19 or less, adult: Secondary | ICD-10-CM | POA: Diagnosis not present

## 2022-06-08 DIAGNOSIS — Z1339 Encounter for screening examination for other mental health and behavioral disorders: Secondary | ICD-10-CM | POA: Diagnosis not present

## 2022-06-17 ENCOUNTER — Encounter: Payer: Self-pay | Admitting: Pulmonary Disease

## 2022-06-17 ENCOUNTER — Ambulatory Visit: Payer: Medicare HMO | Admitting: Pulmonary Disease

## 2022-06-17 VITALS — BP 118/70 | HR 87 | Temp 97.4°F | Ht 62.0 in | Wt 99.2 lb

## 2022-06-17 DIAGNOSIS — J432 Centrilobular emphysema: Secondary | ICD-10-CM

## 2022-06-17 DIAGNOSIS — J449 Chronic obstructive pulmonary disease, unspecified: Secondary | ICD-10-CM

## 2022-06-17 DIAGNOSIS — R911 Solitary pulmonary nodule: Secondary | ICD-10-CM | POA: Diagnosis not present

## 2022-06-17 MED ORDER — PREDNISONE 10 MG PO TABS: ORAL_TABLET | ORAL | 0 refills | Status: DC

## 2022-06-17 NOTE — Progress Notes (Signed)
Synopsis: Referred in Novermber 2019 for COPD.  Subjective:   PATIENT ID: Maylon Cos GENDER: female DOB: 1950/03/08, MRN: [id number]  Chief Complaint  Patient presents with   Follow-up    Ldct 05/24/22, pt states she has been having trouble the last few weeks with her breathing    OV 12/2017: PMH of GERD and COPD. She was diagnosed with COPD by office spiro about 6 years ago. Quit smoking 3 years ago, smoked from age 72 to 1, about 1 ppd for 50 years.  Overall has been doing well but does notice cough on occasion.  She does have some shortness of breath.  Her cough is productive.  She denies fevers denies hemoptysis.  Her cough at times is strong enough to make her vomit.  She does have significant coughing spells worse in the mornings.  She is currently managed using a Trelegy inhaler.  She is not really sure that this made a whole lot of difference.  She used to be on Symbicort (she believes) in the past and she thought that this helped.  OV 12/20/2018: Virtual Visit via Telephone Note Patient with COPD.  Increased cough sputum production shortness of breath and wheezing at home. Observations/Objective: Patient able to speak in complete sentences. Assessment and Plan: Acute exacerbation of COPD -Start prednisone taper -Z-Pak -Refill Trelegy  OV 02/18/2020: Here today for follow-up regarding COPD.  Having ongoing bouts of cough and shortness of breath.  Daily sputum production.  Recent exacerbation requiring antibiotics.  She also felt to have a urinary tract infection.  Doing better at this time but still has cough and dyspnea on exertion.  She tries to walk on her treadmill but has had difficulty with this as well.  Taking her Trelegy regularly.  Patient had new lung nodule on recent CT imaging in October.  Follow-up scheduled for 6 months in April 2022.  OV 06/11/2020: Here to day for follow up regarding copd and nodules.  Here today doing well.  Started on Trelegy as well as  azithromycin Monday Wednesday Friday.  Her shortness of breath and exertional dyspnea have almost resolved.  She is able to keep up with her multiple grandchildren and great-grandchildren.  Very happy about her stability at this time.  She had recent lung cancer screening CT that shows a left upper lobe pulmonary nodule that 7.6 mm stable in size from the previous 38-month follow-up.  She is enrolled in our lung cancer screening program and will have a 1 year CT scan already ordered for her.  OV 12/21/2020: still having a few coughing spells, dry and non-productive. No recent exacerbations.  Doing well on her current regimen and.  She had a slight exacerbation possibly in September it sounds like.  She slowly been recovering with a lingering cough.  Using her Trelegy plus as needed albuterol.  As well as azithromycin Monday Wednesday Friday.  OV 06/28/2021: Patient here today for follow-up for her COPD.  She has been doing really well on her new regimen of Trelegy.  Albuterol and azithromycin Monday Wednesday Friday.  She has had 1 exacerbation this past year.  From respiratory standpoint she is doing well able to complete all of her activities of daily living.  She saw Maralyn Sago a couple months ago with an exacerbation was treated with prednisone.  She feels much better after this.  OV 06/17/2022: Patient here today for CT follow-up after lung cancer screening CT.  Patient also states that she feels increased shortness of breath  and some chest tightness rate and sleep.  Try to get over a mild exacerbation.  Interested in going off prednisone for a few days if this will help her.  She not had any exacerbations this past year.  From respiratory standpoint otherwise doing okay.  Using her Trelegy daily.    Past Medical History:  Diagnosis Date   Allergic rhinitis    Anemia    Anxiety    COPD (chronic obstructive pulmonary disease) (HCC)    Crohn disease (HCC)    Esophageal dysphagia    Family history of  colonic polyps    GERD (gastroesophageal reflux disease)    Hiatal hernia    History of colon polyps    History of Helicobacter pylori infection    Hyperlipidemia    Pituitary microadenoma (HCC)    RLS (restless legs syndrome)      Family History  Problem Relation Age of Onset   Dementia Mother    Colon polyps Mother    Asthma Father    COPD Sister    Depression Sister    Depression Sister    COPD Sister    Colon cancer Neg Hx    Esophageal cancer Neg Hx    Rectal cancer Neg Hx    Stomach cancer Neg Hx       Past Surgical History:  Procedure Laterality Date   COLONOSCOPY  07/21/2016   Colonic polup status post polypectomy. Few erosions and a small ulcer in the terminal ileum ? Importance- could represent Crohn's disease (biopsied)    ESOPHAGOGASTRODUODENOSCOPY  07/21/2016   Distal esophageal stricture status post esophageal dilatation. Small epiphrenic diverticulum. Small hiatal hernia. Incidental duodenal diverticulum   GALLBLADDER SURGERY     TONSILLECTOMY     TUBAL LIGATION      Social History   Socioeconomic History   Marital status: Married    Spouse name: Not on file   Number of children: Not on file   Years of education: Not on file   Highest education level: Not on file  Occupational History   Not on file  Tobacco Use   Smoking status: Former    Packs/day: 1.00    Years: 51.00    Additional pack years: 0.00    Total pack years: 51.00    Types: Cigarettes    Start date: 89    Quit date: 2017    Years since quitting: 7.3   Smokeless tobacco: Never  Vaping Use   Vaping Use: Never used  Substance and Sexual Activity   Alcohol use: Yes    Comment: Occasional, social   Drug use: Never   Sexual activity: Not on file  Other Topics Concern   Not on file  Social History Narrative   Not on file   Social Determinants of Health   Financial Resource Strain: Not on file  Food Insecurity: Not on file  Transportation Needs: Not on file  Physical  Activity: Not on file  Stress: Not on file  Social Connections: Not on file  Intimate Partner Violence: Not on file     Allergies  Allergen Reactions   Codeine Nausea And Vomiting     Outpatient Medications Prior to Visit  Medication Sig Dispense Refill   albuterol (VENTOLIN HFA) 108 (90 Base) MCG/ACT inhaler Inhale 1-2 puffs into the lungs every 6 (six) hours as needed for wheezing or shortness of breath. 18 g 11   alendronate (FOSAMAX) 70 MG tablet Take 70 mg by mouth once a week.  ALPRAZolam (XANAX) 0.5 MG tablet TAKE 1 & 1/2 TABLET BY MOUTH AT BEDTIME  0   atorvastatin (LIPITOR) 20 MG tablet Take 20 mg by mouth every other day.  2   azithromycin (ZITHROMAX) 250 MG tablet TAKE 1 TABLET BY MOUTH ON MONDAY, WEDNESDAY AND FRIDAY 42 tablet 0   calcium carbonate (OS-CAL - DOSED IN MG OF ELEMENTAL CALCIUM) 1250 (500 Ca) MG tablet Take 1 tablet by mouth.     dextromethorphan-guaiFENesin (MUCINEX DM) 30-600 MG 12hr tablet Take 1 tablet by mouth 2 (two) times daily as needed for cough.     escitalopram (LEXAPRO) 10 MG tablet Take 10 mg by mouth daily.     fluticasone (FLONASE) 50 MCG/ACT nasal spray Place 1 spray into both nostrils 2 (two) times daily.     Fluticasone-Umeclidin-Vilant (TRELEGY ELLIPTA) 100-62.5-25 MCG/INH AEPB Inhale 1 puff into the lungs daily. 3 each 3   ipratropium-albuterol (DUONEB) 0.5-2.5 (3) MG/3ML SOLN INHALE 3 MILLILITER EVERY 6 HOURS AS NEEDED FOR COPD EXACERBATION     montelukast (SINGULAIR) 10 MG tablet TAKE 1 TABLET BY MOUTH EVERYDAY AT BEDTIME 90 tablet 3   pantoprazole (PROTONIX) 40 MG tablet TAKE 1 TABLET BY MOUTH ONCE DAILY 90 tablet 4   No facility-administered medications prior to visit.    Review of Systems  Constitutional:  Negative for chills, fever, malaise/fatigue and weight loss.  HENT:  Negative for hearing loss, sore throat and tinnitus.   Eyes:  Negative for blurred vision and double vision.  Respiratory:  Positive for shortness of breath  and wheezing. Negative for cough, hemoptysis, sputum production and stridor.   Cardiovascular:  Negative for chest pain, palpitations, orthopnea, leg swelling and PND.  Gastrointestinal:  Negative for abdominal pain, constipation, diarrhea, heartburn, nausea and vomiting.  Genitourinary:  Negative for dysuria, hematuria and urgency.  Musculoskeletal:  Negative for joint pain and myalgias.  Skin:  Negative for itching and rash.  Neurological:  Negative for dizziness, tingling, weakness and headaches.  Endo/Heme/Allergies:  Negative for environmental allergies. Does not bruise/bleed easily.  Psychiatric/Behavioral:  Negative for depression. The patient is not nervous/anxious and does not have insomnia.   All other systems reviewed and are negative.    Objective:  Physical Exam Vitals reviewed.  Constitutional:      General: She is not in acute distress.    Appearance: She is well-developed.  HENT:     Head: Normocephalic and atraumatic.  Eyes:     General: No scleral icterus.    Conjunctiva/sclera: Conjunctivae normal.     Pupils: Pupils are equal, round, and reactive to light.  Neck:     Vascular: No JVD.     Trachea: No tracheal deviation.  Cardiovascular:     Rate and Rhythm: Normal rate and regular rhythm.     Heart sounds: Normal heart sounds. No murmur heard. Pulmonary:     Effort: Pulmonary effort is normal. No tachypnea, accessory muscle usage or respiratory distress.     Breath sounds: No stridor. No wheezing, rhonchi or rales.  Abdominal:     General: There is no distension.     Palpations: Abdomen is soft.     Tenderness: There is no abdominal tenderness.  Musculoskeletal:        General: No tenderness.     Cervical back: Neck supple.  Lymphadenopathy:     Cervical: No cervical adenopathy.  Skin:    General: Skin is warm and dry.     Capillary Refill: Capillary refill takes less than 2  seconds.     Findings: No rash.  Neurological:     Mental Status: She is  alert and oriented to person, place, and time.  Psychiatric:        Behavior: Behavior normal.      Vitals:   06/17/22 0844  BP: 118/70  Pulse: 87  Temp: (!) 97.4 F (36.3 C)  TempSrc: Oral  SpO2: 97%  Weight: 99 lb 3.2 oz (45 kg)  Height: 5\' 2"  (1.575 m)   97% on RA BMI Readings from Last 3 Encounters:  06/17/22 18.14 kg/m  06/28/21 18.56 kg/m  04/13/21 18.47 kg/m   Wt Readings from Last 3 Encounters:  06/17/22 99 lb 3.2 oz (45 kg)  06/28/21 104 lb 12.8 oz (47.5 kg)  04/13/21 101 lb (45.8 kg)     CBC    Component Value Date/Time   WBC 7.0 09/18/2019 1208   RBC 4.23 09/18/2019 1208   HGB 12.2 09/18/2019 1208   HCT 36.2 09/18/2019 1208   PLT 328.0 09/18/2019 1208   MCV 85.7 09/18/2019 1208   MCH 28.8 11/16/2018 0844   MCHC 33.6 09/18/2019 1208   RDW 14.7 09/18/2019 1208   LYMPHSABS 1.4 09/18/2019 1208   MONOABS 0.8 09/18/2019 1208   EOSABS 0.4 09/18/2019 1208   BASOSABS 0.1 09/18/2019 1208    Chest Imaging:  CT chest October 2021: New to left lower lobe pulmonary nodules 1 at 5.1 mm it was not present before recommending repeat follow-up in April 2022. The patient's images have been independently reviewed by me.    April 2022: CT lung cancer screening lung RADS 2 Left upper lobe 7.6 mm pulmonary nodule The patient's images have been independently reviewed by me.    Pulmonary Functions Testing Results:     Latest Ref Rng & Units 12/11/2018   12:05 PM  PFT Results  FVC-Pre L 1.27   FVC-Predicted Pre % 46   FVC-Post L 1.52   FVC-Predicted Post % 56   Pre FEV1/FVC % % 38   Post FEV1/FCV % % 38   FEV1-Pre L 0.48   FEV1-Predicted Pre % 23   FEV1-Post L 0.58   DLCO uncorrected ml/min/mmHg 10.11   DLCO UNC% % 56   DLVA Predicted % 74   TLC L 4.86   TLC % Predicted % 104   RV % Predicted % 171     FeNO: None   Pathology: None   Echocardiogram: None   Heart Catheterization: None     Assessment & Plan:   Left upper lobe pulmonary  nodule - Plan: CT Chest Wo Contrast  Stage 4 very severe COPD by GOLD classification (HCC)  Centrilobular emphysema (HCC)  Right upper lobe pulmonary nodule   Discussion:  This is a 72 year old female, former smoker, severe COPD, advanced centrilobular emphysema on triple therapy inhaler regimen plus azithromycin Monday Wednesday Friday.  Plus uptake in meter and albuterol.  Plan: Continue azithromycin Monday Wednesday Friday Continue Trelegy 100 daily Continue albuterol as needed Noncontrasted CT chest in 3 months for Follow-up with his new pulmonary nodule in the left lung. She also has this right upper lobe lesion that looks more inflammatory. Can return to see me or SG, NP in 3 months after CT chest complete.    Current Outpatient Medications:    albuterol (VENTOLIN HFA) 108 (90 Base) MCG/ACT inhaler, Inhale 1-2 puffs into the lungs every 6 (six) hours as needed for wheezing or shortness of breath., Disp: 18 g, Rfl: 11   alendronate (  FOSAMAX) 70 MG tablet, Take 70 mg by mouth once a week., Disp: , Rfl:    ALPRAZolam (XANAX) 0.5 MG tablet, TAKE 1 & 1/2 TABLET BY MOUTH AT BEDTIME, Disp: , Rfl: 0   atorvastatin (LIPITOR) 20 MG tablet, Take 20 mg by mouth every other day., Disp: , Rfl: 2   azithromycin (ZITHROMAX) 250 MG tablet, TAKE 1 TABLET BY MOUTH ON MONDAY, WEDNESDAY AND FRIDAY, Disp: 42 tablet, Rfl: 0   calcium carbonate (OS-CAL - DOSED IN MG OF ELEMENTAL CALCIUM) 1250 (500 Ca) MG tablet, Take 1 tablet by mouth., Disp: , Rfl:    dextromethorphan-guaiFENesin (MUCINEX DM) 30-600 MG 12hr tablet, Take 1 tablet by mouth 2 (two) times daily as needed for cough., Disp: , Rfl:    escitalopram (LEXAPRO) 10 MG tablet, Take 10 mg by mouth daily., Disp: , Rfl:    fluticasone (FLONASE) 50 MCG/ACT nasal spray, Place 1 spray into both nostrils 2 (two) times daily., Disp: , Rfl:    Fluticasone-Umeclidin-Vilant (TRELEGY ELLIPTA) 100-62.5-25 MCG/INH AEPB, Inhale 1 puff into the lungs daily.,  Disp: 3 each, Rfl: 3   ipratropium-albuterol (DUONEB) 0.5-2.5 (3) MG/3ML SOLN, INHALE 3 MILLILITER EVERY 6 HOURS AS NEEDED FOR COPD EXACERBATION, Disp: , Rfl:    montelukast (SINGULAIR) 10 MG tablet, TAKE 1 TABLET BY MOUTH EVERYDAY AT BEDTIME, Disp: 90 tablet, Rfl: 3   pantoprazole (PROTONIX) 40 MG tablet, TAKE 1 TABLET BY MOUTH ONCE DAILY, Disp: 90 tablet, Rfl: 4   predniSONE (DELTASONE) 10 MG tablet, Take 4 tabs by mouth once daily x4 days, then 3 tabs x4 days, 2 tabs x4 days, 1 tab x4 days and stop., Disp: 40 tablet, Rfl: 0   Josephine Igo, DO [facility] 06/17/2022 9:15 AM

## 2022-06-17 NOTE — Patient Instructions (Addendum)
Thank you for visiting Dr. Tonia Brooms at [facility]. Today we recommend the following:  Orders Placed This Encounter  Procedures   CT Chest Wo Contrast   Meds ordered this encounter  Medications   predniSONE (DELTASONE) 10 MG tablet    Sig: Take 4 tabs by mouth once daily x4 days, then 3 tabs x4 days, 2 tabs x4 days, 1 tab x4 days and stop.    Dispense:  40 tablet    Refill:  0   Return in about 3 months (around 09/17/2022), or if symptoms worsen or fail to improve, for with APP or Dr. Tonia Brooms, after Bronchoscopy.    Please do your part to reduce the spread of COVID-19.

## 2022-06-21 ENCOUNTER — Other Ambulatory Visit: Payer: Self-pay | Admitting: Gastroenterology

## 2022-06-21 DIAGNOSIS — K219 Gastro-esophageal reflux disease without esophagitis: Secondary | ICD-10-CM

## 2022-07-08 DIAGNOSIS — F411 Generalized anxiety disorder: Secondary | ICD-10-CM | POA: Diagnosis not present

## 2022-07-08 DIAGNOSIS — K219 Gastro-esophageal reflux disease without esophagitis: Secondary | ICD-10-CM | POA: Diagnosis not present

## 2022-07-08 DIAGNOSIS — E782 Mixed hyperlipidemia: Secondary | ICD-10-CM | POA: Diagnosis not present

## 2022-07-08 DIAGNOSIS — J449 Chronic obstructive pulmonary disease, unspecified: Secondary | ICD-10-CM | POA: Diagnosis not present

## 2022-07-09 DIAGNOSIS — J449 Chronic obstructive pulmonary disease, unspecified: Secondary | ICD-10-CM | POA: Diagnosis not present

## 2022-07-11 ENCOUNTER — Other Ambulatory Visit: Payer: Self-pay | Admitting: Pulmonary Disease

## 2022-08-08 DIAGNOSIS — J449 Chronic obstructive pulmonary disease, unspecified: Secondary | ICD-10-CM | POA: Diagnosis not present

## 2022-09-08 DIAGNOSIS — J449 Chronic obstructive pulmonary disease, unspecified: Secondary | ICD-10-CM | POA: Diagnosis not present

## 2022-09-09 ENCOUNTER — Other Ambulatory Visit: Payer: Self-pay | Admitting: Pulmonary Disease

## 2022-09-16 DIAGNOSIS — J441 Chronic obstructive pulmonary disease with (acute) exacerbation: Secondary | ICD-10-CM | POA: Diagnosis not present

## 2022-09-16 DIAGNOSIS — R051 Acute cough: Secondary | ICD-10-CM | POA: Diagnosis not present

## 2022-09-16 DIAGNOSIS — R42 Dizziness and giddiness: Secondary | ICD-10-CM | POA: Diagnosis not present

## 2022-09-16 DIAGNOSIS — M25511 Pain in right shoulder: Secondary | ICD-10-CM | POA: Diagnosis not present

## 2022-09-16 DIAGNOSIS — R9431 Abnormal electrocardiogram [ECG] [EKG]: Secondary | ICD-10-CM | POA: Diagnosis not present

## 2022-09-16 DIAGNOSIS — R000 Tachycardia, unspecified: Secondary | ICD-10-CM | POA: Diagnosis not present

## 2022-09-16 DIAGNOSIS — S93401A Sprain of unspecified ligament of right ankle, initial encounter: Secondary | ICD-10-CM | POA: Diagnosis not present

## 2022-09-16 DIAGNOSIS — M25571 Pain in right ankle and joints of right foot: Secondary | ICD-10-CM | POA: Diagnosis not present

## 2022-09-16 DIAGNOSIS — S0990XA Unspecified injury of head, initial encounter: Secondary | ICD-10-CM | POA: Diagnosis not present

## 2022-09-16 DIAGNOSIS — S4991XA Unspecified injury of right shoulder and upper arm, initial encounter: Secondary | ICD-10-CM | POA: Diagnosis not present

## 2022-09-16 DIAGNOSIS — R0602 Shortness of breath: Secondary | ICD-10-CM | POA: Diagnosis not present

## 2022-09-16 DIAGNOSIS — M542 Cervicalgia: Secondary | ICD-10-CM | POA: Diagnosis not present

## 2022-09-16 DIAGNOSIS — Z1152 Encounter for screening for COVID-19: Secondary | ICD-10-CM | POA: Diagnosis not present

## 2022-09-16 DIAGNOSIS — R509 Fever, unspecified: Secondary | ICD-10-CM | POA: Diagnosis not present

## 2022-09-20 DIAGNOSIS — R9431 Abnormal electrocardiogram [ECG] [EKG]: Secondary | ICD-10-CM | POA: Diagnosis not present

## 2022-09-27 ENCOUNTER — Ambulatory Visit (HOSPITAL_BASED_OUTPATIENT_CLINIC_OR_DEPARTMENT_OTHER)
Admission: RE | Admit: 2022-09-27 | Discharge: 2022-09-27 | Disposition: A | Discharge status: Home / Self Care | Payer: Medicare HMO | Source: Ambulatory Visit | Attending: Pulmonary Disease | Admitting: Pulmonary Disease

## 2022-09-27 ENCOUNTER — Encounter (HOSPITAL_BASED_OUTPATIENT_CLINIC_OR_DEPARTMENT_OTHER): Payer: Self-pay

## 2022-09-27 DIAGNOSIS — Z87891 Personal history of nicotine dependence: Secondary | ICD-10-CM | POA: Diagnosis not present

## 2022-09-27 DIAGNOSIS — R911 Solitary pulmonary nodule: Secondary | ICD-10-CM | POA: Insufficient documentation

## 2022-10-01 ENCOUNTER — Other Ambulatory Visit: Payer: Self-pay | Admitting: Gastroenterology

## 2022-10-01 DIAGNOSIS — K219 Gastro-esophageal reflux disease without esophagitis: Secondary | ICD-10-CM

## 2022-10-04 ENCOUNTER — Encounter: Payer: Self-pay | Admitting: Acute Care

## 2022-10-04 ENCOUNTER — Ambulatory Visit: Payer: Medicare HMO | Admitting: Acute Care

## 2022-10-04 ENCOUNTER — Telehealth: Payer: Self-pay | Admitting: Acute Care

## 2022-10-04 VITALS — BP 122/76 | HR 78 | Temp 98.0°F | Ht 62.0 in | Wt 99.4 lb

## 2022-10-04 DIAGNOSIS — Z87891 Personal history of nicotine dependence: Secondary | ICD-10-CM | POA: Diagnosis not present

## 2022-10-04 DIAGNOSIS — Z122 Encounter for screening for malignant neoplasm of respiratory organs: Secondary | ICD-10-CM

## 2022-10-04 DIAGNOSIS — R918 Other nonspecific abnormal finding of lung field: Secondary | ICD-10-CM | POA: Diagnosis not present

## 2022-10-04 DIAGNOSIS — R9389 Abnormal findings on diagnostic imaging of other specified body structures: Secondary | ICD-10-CM

## 2022-10-04 DIAGNOSIS — J449 Chronic obstructive pulmonary disease, unspecified: Secondary | ICD-10-CM | POA: Diagnosis not present

## 2022-10-04 NOTE — Patient Instructions (Addendum)
It is good to see you today. Your repeat CT Chest shows the nodules we were concerned about have decreased in size.  This is great news.  Next annual screening scan will be due 09/2023.  We will call you closer to the time to schedule.  Follow up with Dr. Tonia Brooms in 3 months for COPD maintenance.  This will be November. Continue Trelegy 1 puff once daily  Rinse mouth after use Note your daily symptoms > remember "red flags" for COPD:  Increase in cough, increase in sputum production, increase in shortness of breath or activity intolerance. If you notice these symptoms, please call to be seen.    Please contact office for sooner follow up if symptoms do not improve or worsen or seek emergency care

## 2022-10-04 NOTE — Progress Notes (Signed)
History of Present Illness Amanda Cobb is a 72 y.o. female former smoker ( 1 PPD x 50 years, Quit 2017) with PMH of GERD and COPD. She is followed by Dr. Tonia Brooms, and the Lung Cancer Screening Program.    10/04/2022 Pt. Presents for follow up to review 3 month follow up of her repeat CT chest after 4B finding 05/24/2022.She has been sick after a trip to [facility]. She ended having to go to the ED while she was there for a COPD flare. She was treated with a prednisone taper. She has completed the taper and she is better.  Her repeat CT Showed the nodules of concern have decreased in size. She will resume annual scanning next due 09/2023. We will have her follow up with Dr. Tonia Brooms in 3 months for her COPD maintenance .  Test Results: LDCT 09/27/2022 Lungs/Pleura: Centrilobular and paraseptal emphysema evident. Numerous scattered tiny calcified and noncalcified pulmonary nodules are stable in the interval. 8.5 mm central left lung nodule of concern on the previous study has decreased in the interval measuring 6.6 mm on image 166 today. The new irregular right upper lobe pulmonary nodule identified previously at 7.3 mm is 3.7 mm today on image 46. No new suspicious pulmonary nodule or mass. No focal airspace consolidation. No pleural effusion. IMPRESSION: Lung-RADS 2, benign appearance or behavior. Continue annual screening with low-dose chest CT without contrast in 12 months. The new nodules of concern identified previously have both decreased in size in the interval suggesting resolving infectious/inflammatory etiology.    05/2022 LDCT New left lower lobe perihilar solid pulmonary nodule measuring 8.5 mm on image 177. Lung-RADS 4B, suspicious. Additional imaging evaluation or consultation with Pulmonology or Thoracic Surgery recommended. 2. Additional new irregular solid pulmonary nodule of the right upper lobe measuring 7.3 mm. 3. Coronary artery calcifications, aortic  Atherosclerosis (ICD10-I70.0) and Emphysema (ICD10-J43.9).    Latest Ref Rng & Units 09/18/2019   12:08 PM 11/16/2018    8:44 AM  CBC  WBC 4.0 - 10.5 K/uL 7.0  9.1   Hemoglobin 12.0 - 15.0 g/dL 16.1  09.6   Hematocrit 36.0 - 46.0 % 36.2  39.5   Platelets 150.0 - 400.0 K/uL 328.0  411        Latest Ref Rng & Units 09/18/2019   12:08 PM 02/14/2019    9:27 AM 11/16/2018    8:44 AM  BMP  Glucose 70 - 99 mg/dL 86  045  409   BUN 6 - 23 mg/dL 15  15  5    Creatinine 0.40 - 1.20 mg/dL 8.11  9.14  7.82   Sodium 135 - 145 mEq/L 136  139  139   Potassium 3.5 - 5.1 mEq/L 4.4  3.1  3.3   Chloride 96 - 112 mEq/L 101  103  100   CO2 19 - 32 mEq/L 28  29  27    Calcium 8.4 - 10.5 mg/dL 9.3  9.0  9.5     BNP No results found for: "BNP"  ProBNP No results found for: "PROBNP"  PFT    Component Value Date/Time   FEV1PRE 0.48 12/11/2018 1205   FEV1POST 0.58 12/11/2018 1205   FVCPRE 1.27 12/11/2018 1205   FVCPOST 1.52 12/11/2018 1205   TLC 4.86 12/11/2018 1205   DLCOUNC 10.11 12/11/2018 1205   PREFEV1FVCRT 38 12/11/2018 1205   PSTFEV1FVCRT 38 12/11/2018 1205    CT CHEST LCS NODULE F/U LOW DOSE WO CONTRAST  Result Date: 10/04/2022 CLINICAL DATA:  72 year old female with 51 pack-year history of smoking. 8.5 mm left pulmonary nodule on previous screening CT. EXAM: CT CHEST WITHOUT CONTRAST FOR LUNG CANCER SCREENING NODULE FOLLOW-UP TECHNIQUE: Multidetector CT imaging of the chest was performed following the standard protocol without IV contrast. RADIATION DOSE REDUCTION: This exam was performed according to the departmental dose-optimization program which includes automated exposure control, adjustment of the mA and/or kV according to patient size and/or use of iterative reconstruction technique. COMPARISON:  05/24/2022 FINDINGS: Cardiovascular: The heart size is normal. No substantial pericardial effusion. Coronary artery calcification is evident. Mild atherosclerotic calcification is noted in the  wall of the thoracic aorta. Ascending thoracic aorta measures 4 cm diameter. Mediastinum/Nodes: No mediastinal lymphadenopathy. No evidence for gross hilar lymphadenopathy although assessment is limited by the lack of intravenous contrast on the current study. The esophagus has normal imaging features. There is no axillary lymphadenopathy. Lungs/Pleura: Centrilobular and paraseptal emphysema evident. Numerous scattered tiny calcified and noncalcified pulmonary nodules are stable in the interval. 8.5 mm central left lung nodule of concern on the previous study has decreased in the interval measuring 6.6 mm on image 166 today. The new irregular right upper lobe pulmonary nodule identified previously at 7.3 mm is 3.7 mm today on image 46. No new suspicious pulmonary nodule or mass. No focal airspace consolidation. No pleural effusion. Upper Abdomen: Visualized portion of the upper abdomen is unremarkable. Musculoskeletal: No worrisome lytic or sclerotic osseous abnormality. IMPRESSION: Lung-RADS 2, benign appearance or behavior. Continue annual screening with low-dose chest CT without contrast in 12 months. The new nodules of concern identified previously have both decreased in size in the interval suggesting resolving infectious/inflammatory etiology. Emphysema (ICD10-J43.9) and Aortic Atherosclerosis (ICD10-170.0) Electronically Signed   By: Kennith Center M.D.   On: 10/04/2022 09:56     Past medical hx Past Medical History:  Diagnosis Date   Allergic rhinitis    Anemia    Anxiety    COPD (chronic obstructive pulmonary disease) (HCC)    Crohn disease (HCC)    Esophageal dysphagia    Family history of colonic polyps    GERD (gastroesophageal reflux disease)    Hiatal hernia    History of colon polyps    History of Helicobacter pylori infection    Hyperlipidemia    Pituitary microadenoma (HCC)    RLS (restless legs syndrome)      Social History   Tobacco Use   Smoking status: Former    Current  packs/day: 0.00    Average packs/day: 1 pack/day for 52.0 years (52.0 ttl pk-yrs)    Types: Cigarettes    Start date: 57    Quit date: 2017    Years since quitting: 7.6   Smokeless tobacco: Never  Vaping Use   Vaping status: Never Used  Substance Use Topics   Alcohol use: Yes    Comment: Occasional, social   Drug use: Never    Amanda Cobb reports that she quit smoking about 7 years ago. Her smoking use included cigarettes. She started smoking about 59 years ago. She has a 52 pack-year smoking history. She has never used smokeless tobacco. She reports current alcohol use. She reports that she does not use drugs.  Tobacco Cessation: Former smoker , quit 2017 with a 52 pack year smoking history   Past surgical hx, Family hx, Social hx all reviewed.  Current Outpatient Medications on File Prior to Visit  Medication Sig   albuterol (VENTOLIN HFA) 108 (90 Base) MCG/ACT inhaler INHALE 1 TO 2 PUFFS  BY MOUTH EVERY 6 HOURS AS NEEDED FOR WHEEZING FOR SHORTNESS OF BREATH   alendronate (FOSAMAX) 70 MG tablet Take 70 mg by mouth once a week.   ALPRAZolam (XANAX) 0.5 MG tablet TAKE 1 & 1/2 TABLET BY MOUTH AT BEDTIME   atorvastatin (LIPITOR) 20 MG tablet Take 20 mg by mouth every other day.   azithromycin (ZITHROMAX) 250 MG tablet TAKE 1 TABLET BY MOUTH ON MONDAY, WEDNESDAY AND FRIDAY   calcium carbonate (OS-CAL - DOSED IN MG OF ELEMENTAL CALCIUM) 1250 (500 Ca) MG tablet Take 1 tablet by mouth.   dextromethorphan-guaiFENesin (MUCINEX DM) 30-600 MG 12hr tablet Take 1 tablet by mouth 2 (two) times daily as needed for cough.   escitalopram (LEXAPRO) 10 MG tablet Take 10 mg by mouth daily.   fluticasone (FLONASE) 50 MCG/ACT nasal spray Place 1 spray into both nostrils 2 (two) times daily.   Fluticasone-Umeclidin-Vilant (TRELEGY ELLIPTA) 100-62.5-25 MCG/INH AEPB Inhale 1 puff into the lungs daily.   ipratropium-albuterol (DUONEB) 0.5-2.5 (3) MG/3ML SOLN INHALE 3 MILLILITER EVERY 6 HOURS AS NEEDED FOR  COPD EXACERBATION   montelukast (SINGULAIR) 10 MG tablet TAKE 1 TABLET BY MOUTH EVERYDAY AT BEDTIME   pantoprazole (PROTONIX) 40 MG tablet TAKE 1 TABLET BY MOUTH ONCE DAILY. NEED OFFICE VISIT FOR FURTHER REFILLS   No current facility-administered medications on file prior to visit.     Allergies  Allergen Reactions   Codeine Nausea And Vomiting    Review Of Systems:  Constitutional:   No  weight loss, night sweats,  Fevers, chills, fatigue, or  lassitude.  HEENT:   No headaches,  Difficulty swallowing,  Tooth/dental problems, or  Sore throat,                No sneezing, itching, ear ache, nasal congestion, post nasal drip,   CV:  No chest pain,  Orthopnea, PND, swelling in lower extremities, anasarca, dizziness, palpitations, syncope.   GI  No heartburn, indigestion, abdominal pain, nausea, vomiting, diarrhea, change in bowel habits, loss of appetite, bloody stools.   Resp: No shortness of breath with exertion or at rest.  No excess mucus, no productive cough,  No non-productive cough,  No coughing up of blood.  No change in color of mucus.  No wheezing.  No chest wall deformity  Skin: no rash or lesions.  GU: no dysuria, change in color of urine, no urgency or frequency.  No flank pain, no hematuria   MS:  + ankle  joint pain or swelling.  No decreased range of motion.  No back pain.  Psych:  No change in mood or affect. No depression or anxiety.  No memory loss.   Vital Signs BP 122/76 (BP Location: Right Arm, Cuff Size: Normal)   Pulse 78   Temp 98 F (36.7 C) (Oral)   Ht 5\' 2"  (1.575 m)   Wt 99 lb 6.4 oz (45.1 kg)   SpO2 94%   BMI 18.18 kg/m    Physical Exam:  General- No distress,  A&Ox3, pleasant ENT: No sinus tenderness, TM clear, pale nasal mucosa, no oral exudate,no post nasal drip, no LAN Cardiac: S1, S2, regular rate and rhythm, no murmur Chest: No wheeze/ rales/ dullness; no accessory muscle use, no nasal flaring, no sternal retractions, diminished per  bases Abd.: Soft Non-tender, ND, BS +, Body mass index is 18.18 kg/m.  Ext: No clubbing cyanosis, edema Neuro:  normal strength, MAE x 4, A&O x 3 Skin: No rashes, warm and dry Psych: normal mood  and behavior   Assessment/Plan Resolving pulmonary nodules noted on Lung Cancer Screening scan Former smoker  Recent COPD Flare Plan Your repeat CT Chest shows the nodules we were concerned about have decreased in size.  This is great news.  Next annual screening scan will be due 09/2023.  We will call you closer to the time to schedule.  Follow up with Dr. Tonia Brooms in 3 months for COPD maintenance.  This will be November. Continue Trelegy 1 puff once daily  Rinse mouth after use Note your daily symptoms > remember "red flags" for COPD:  Increase in cough, increase in sputum production, increase in shortness of breath or activity intolerance. If you notice these symptoms, please call to be seen.    Please contact office for sooner follow up if symptoms do not improve or worsen or seek emergency care   I spent 20 minutes dedicated to the care of this patient on the date of this encounter to include pre-visit review of records, face-to-face time with the patient discussing conditions above, post visit ordering of testing, clinical documentation with the electronic health record, making appropriate referrals as documented, and communicating necessary information to the patient's healthcare team.   Bevelyn Ngo, NP 10/04/2022  5:00 PM

## 2022-10-04 NOTE — Telephone Encounter (Signed)
This patient will need to return to the screening population starting 09/2023. Thanks so much.

## 2022-10-05 NOTE — Telephone Encounter (Signed)
Order placed for annual LDCT 2025 

## 2022-10-07 DIAGNOSIS — Z1231 Encounter for screening mammogram for malignant neoplasm of breast: Secondary | ICD-10-CM | POA: Diagnosis not present

## 2022-10-09 DIAGNOSIS — J449 Chronic obstructive pulmonary disease, unspecified: Secondary | ICD-10-CM | POA: Diagnosis not present

## 2022-10-12 ENCOUNTER — Other Ambulatory Visit: Payer: Self-pay | Admitting: Pulmonary Disease

## 2022-10-19 DIAGNOSIS — F5104 Psychophysiologic insomnia: Secondary | ICD-10-CM | POA: Diagnosis not present

## 2022-10-19 DIAGNOSIS — Z23 Encounter for immunization: Secondary | ICD-10-CM | POA: Diagnosis not present

## 2022-10-19 DIAGNOSIS — Z79899 Other long term (current) drug therapy: Secondary | ICD-10-CM | POA: Diagnosis not present

## 2022-10-19 DIAGNOSIS — E559 Vitamin D deficiency, unspecified: Secondary | ICD-10-CM | POA: Diagnosis not present

## 2022-10-19 DIAGNOSIS — Z0000 Encounter for general adult medical examination without abnormal findings: Secondary | ICD-10-CM | POA: Diagnosis not present

## 2022-10-19 DIAGNOSIS — M810 Age-related osteoporosis without current pathological fracture: Secondary | ICD-10-CM | POA: Diagnosis not present

## 2022-10-19 DIAGNOSIS — J432 Centrilobular emphysema: Secondary | ICD-10-CM | POA: Diagnosis not present

## 2022-10-19 DIAGNOSIS — E782 Mixed hyperlipidemia: Secondary | ICD-10-CM | POA: Diagnosis not present

## 2022-10-19 DIAGNOSIS — J449 Chronic obstructive pulmonary disease, unspecified: Secondary | ICD-10-CM | POA: Diagnosis not present

## 2022-10-19 DIAGNOSIS — F411 Generalized anxiety disorder: Secondary | ICD-10-CM | POA: Diagnosis not present

## 2022-10-25 NOTE — Progress Notes (Signed)
CT reviewed at last OV with SG   Thanks,  BLI  Josephine Igo, DO [facility] 10/25/2022 5:07 PM

## 2022-10-28 DIAGNOSIS — Z681 Body mass index (BMI) 19 or less, adult: Secondary | ICD-10-CM | POA: Diagnosis not present

## 2022-10-28 DIAGNOSIS — H8113 Benign paroxysmal vertigo, bilateral: Secondary | ICD-10-CM | POA: Diagnosis not present

## 2022-11-08 DIAGNOSIS — J449 Chronic obstructive pulmonary disease, unspecified: Secondary | ICD-10-CM | POA: Diagnosis not present

## 2022-11-10 DIAGNOSIS — H8113 Benign paroxysmal vertigo, bilateral: Secondary | ICD-10-CM | POA: Diagnosis not present

## 2022-11-22 DIAGNOSIS — M25562 Pain in left knee: Secondary | ICD-10-CM | POA: Diagnosis not present

## 2022-11-22 DIAGNOSIS — M1712 Unilateral primary osteoarthritis, left knee: Secondary | ICD-10-CM | POA: Diagnosis not present

## 2022-12-09 DIAGNOSIS — J449 Chronic obstructive pulmonary disease, unspecified: Secondary | ICD-10-CM | POA: Diagnosis not present

## 2022-12-23 ENCOUNTER — Telehealth: Payer: Self-pay | Admitting: Pulmonary Disease

## 2022-12-23 NOTE — Telephone Encounter (Signed)
Patient requesting samples of trelegy due to being in the donut hole. Please advise

## 2022-12-26 ENCOUNTER — Telehealth: Payer: Self-pay | Admitting: Gastroenterology

## 2022-12-26 DIAGNOSIS — K219 Gastro-esophageal reflux disease without esophagitis: Secondary | ICD-10-CM

## 2022-12-26 MED ORDER — PANTOPRAZOLE SODIUM 40 MG PO TBEC: 40.0000 mg | DELAYED_RELEASE_TABLET | Freq: Every day | ORAL | 0 refills | Status: AC

## 2022-12-26 NOTE — Telephone Encounter (Signed)
I called and left Amanda Cobb a detailed message that I have sent her refill to [facility] in [place].

## 2022-12-26 NOTE — Telephone Encounter (Signed)
Patient called and stated she needing a refill on Pantroprazole 40 mg until she is able to see Dr. Chales Abrahams on February 26. Please advise.

## 2023-01-02 MED ORDER — TRELEGY ELLIPTA 100-62.5-25 MCG/ACT IN AEPB: 1.0000 | INHALATION_SPRAY | Freq: Every day | RESPIRATORY_TRACT | Status: DC

## 2023-01-02 NOTE — Telephone Encounter (Signed)
Spoke with the pt  She states her PCP already provided her with trelegy samples  Nothing further needed

## 2023-01-08 DIAGNOSIS — J449 Chronic obstructive pulmonary disease, unspecified: Secondary | ICD-10-CM | POA: Diagnosis not present

## 2023-01-21 ENCOUNTER — Other Ambulatory Visit: Payer: Self-pay | Admitting: Pulmonary Disease

## 2023-03-02 ENCOUNTER — Ambulatory Visit: Payer: Medicare HMO | Admitting: Pulmonary Disease

## 2023-03-06 ENCOUNTER — Ambulatory Visit: Payer: Medicare HMO | Admitting: Acute Care

## 2023-04-05 ENCOUNTER — Encounter: Payer: Self-pay | Admitting: Gastroenterology

## 2023-04-05 ENCOUNTER — Ambulatory Visit: Payer: Medicare HMO | Admitting: Gastroenterology

## 2023-04-05 VITALS — BP 114/70 | HR 76 | Ht 62.0 in | Wt 99.5 lb

## 2023-04-05 DIAGNOSIS — K449 Diaphragmatic hernia without obstruction or gangrene: Secondary | ICD-10-CM | POA: Diagnosis not present

## 2023-04-05 DIAGNOSIS — Z860100 Personal history of colon polyps, unspecified: Secondary | ICD-10-CM

## 2023-04-05 DIAGNOSIS — K219 Gastro-esophageal reflux disease without esophagitis: Secondary | ICD-10-CM

## 2023-04-05 DIAGNOSIS — K222 Esophageal obstruction: Secondary | ICD-10-CM | POA: Diagnosis not present

## 2023-04-05 DIAGNOSIS — K50012 Crohn's disease of small intestine with intestinal obstruction: Secondary | ICD-10-CM | POA: Diagnosis not present

## 2023-04-05 DIAGNOSIS — Z860101 Personal history of adenomatous and serrated colon polyps: Secondary | ICD-10-CM | POA: Diagnosis not present

## 2023-04-05 DIAGNOSIS — K50919 Crohn's disease, unspecified, with unspecified complications: Secondary | ICD-10-CM

## 2023-04-05 MED ORDER — PANTOPRAZOLE SODIUM 40 MG PO TBEC: 40.0000 mg | DELAYED_RELEASE_TABLET | Freq: Every day | ORAL | 3 refills | Status: AC

## 2023-04-05 NOTE — Progress Notes (Signed)
 Chief Complaint: FU  Referring Provider:  Julianne Handler, NP      ASSESSMENT AND PLAN;   #1. GERD with HH/eso stricture.   #2. Crohn's disease with distal TI stricture. Dx 07/2016 on colon. UGI with SB series 08/2016 with moderate to severe narrowing of TI.  Asymptomatic. Declined Biologics or any treatment.  Has been seen by Dr. Georgiana Shore who also recommends watchful waiting.  #4.  H/O colonic polyps 07/2016 (Bx- TA)  Plan: -Protonix 40mg  po qd #90, 4RF -Wants to hold off on EGD/colon.  She understands risks and benefits.  She will get in touch with [place] if she has worsening of dysphagia. -Small but more frequent meals. -She has appointment with Burke Keels for routine physical in the next 2 weeks.  I have given her written instructions to check CBC, CMP, CRP at that time.  I have also given patient our fax number and our contact numbers. -FU as needed.   HPI:    Amanda Cobb is a 73 y.o. female  Known to me from [facility] practice With multiple problems including COPD, lung nodule, anxiety/depression  Doing much better.  Here for medication refill on Protonix.  Denies having any GI complaints.  No dysphagia.  No heartburn.  Would occasionally get constipated.  Better with increased water intake.  No abdominal pain.  No weight loss.  Wants to hold off on EGD/colonoscopy at this time.  Does not want to go on Biologics or any meds  Wt Readings from Last 3 Encounters:  04/05/23 99 lb 8 oz (45.1 kg)  10/04/22 99 lb 6.4 oz (45.1 kg)  06/17/22 99 lb 3.2 oz (45 kg)      Past GI work-up:  CTE 09/24/2019-mild progression of Crohn's disease without any fistulas.  She does have a stricture as before.  No obvious obstruction at this point. C-reactive protein was normal Calprotectin was elevated Refusing biologicals  EGD with dilatation 10/17/2019 - Mild Presbyesophagus. - Benign-appearing esophageal stenosis. Dilated. - 2 cm hiatal hernia. - Gastritis. Biopsied. -  Non-bleeding incidental duodenal diverticulum.  -EGD with dilatation 07/21/2016: Distal esophageal stricture s/p dilatation to 16.5 mm TTS balloon, small epiphrenic diverticulum, small HH, incidental duodenal diverticulum.  -Colonoscopy 07/21/2016 ([facility]): 1 cm colonic polyp s/p polypectomy (Bx-TA), erosions and small ulcer in terminal ileum. Bx-consistent with Crohn's.  -Upper GI with small bowel follow-through 08/11/2016: Moderate to severe TI stricture.  -Normal CBC, CMP, CRP at that time.  Positive IBD serology consistent with Crohn's.  Refused treatment.  Has quit smoking. Past Medical History:  Diagnosis Date   Allergic rhinitis    Anemia    Anxiety    COPD (chronic obstructive pulmonary disease) (HCC)    Crohn disease (HCC)    Esophageal dysphagia    Family history of colonic polyps    GERD (gastroesophageal reflux disease)    Hiatal hernia    History of colon polyps    History of Helicobacter pylori infection    Hyperlipidemia    Pituitary microadenoma (HCC)    RLS (restless legs syndrome)     Past Surgical History:  Procedure Laterality Date   COLONOSCOPY  07/21/2016   Colonic polup status post polypectomy. Few erosions and a small ulcer in the terminal ileum ? Importance- could represent Crohn's disease (biopsied)    ESOPHAGOGASTRODUODENOSCOPY  07/21/2016   Distal esophageal stricture status post esophageal dilatation. Small epiphrenic diverticulum. Small hiatal hernia. Incidental duodenal diverticulum   GALLBLADDER SURGERY     TONSILLECTOMY  TUBAL LIGATION      Family History  Problem Relation Age of Onset   Dementia Mother    Colon polyps Mother    Asthma Father    COPD Sister    Depression Sister    Depression Sister    COPD Sister    Colon cancer Neg Hx    Esophageal cancer Neg Hx    Rectal cancer Neg Hx    Stomach cancer Neg Hx     Social History   Tobacco Use   Smoking status: Former    Current packs/day: 0.00    Average packs/day: 1 pack/day for  52.0 years (52.0 ttl pk-yrs)    Types: Cigarettes    Start date: 54    Quit date: 2017    Years since quitting: 8.1   Smokeless tobacco: Never  Vaping Use   Vaping status: Never Used  Substance Use Topics   Alcohol use: Yes    Comment: Occasional, social   Drug use: Never    Current Outpatient Medications  Medication Sig Dispense Refill   albuterol (VENTOLIN HFA) 108 (90 Base) MCG/ACT inhaler INHALE 1 TO 2 PUFFS BY MOUTH EVERY 6 HOURS AS NEEDED FOR WHEEZING FOR SHORTNESS OF BREATH 18 g 11   alendronate (FOSAMAX) 70 MG tablet Take 70 mg by mouth once a week.     ALPRAZolam (XANAX) 0.5 MG tablet TAKE 1 & 1/2 TABLET BY MOUTH AT BEDTIME  0   atorvastatin (LIPITOR) 20 MG tablet Take 20 mg by mouth every other day.  2   azithromycin (ZITHROMAX) 250 MG tablet TAKE 1 TABLET BY MOUTH ONCE DAILY ON  MONDAY,  WEDNESDAY  AND  FRIDAY 42 tablet 0   calcium carbonate (OS-CAL - DOSED IN MG OF ELEMENTAL CALCIUM) 1250 (500 Ca) MG tablet Take 1 tablet by mouth.     dextromethorphan-guaiFENesin (MUCINEX DM) 30-600 MG 12hr tablet Take 1 tablet by mouth 2 (two) times daily as needed for cough.     escitalopram (LEXAPRO) 10 MG tablet Take 10 mg by mouth daily.     fluticasone (FLONASE) 50 MCG/ACT nasal spray Place 1 spray into both nostrils 2 (two) times daily.     Fluticasone-Umeclidin-Vilant (TRELEGY ELLIPTA) 100-62.5-25 MCG/ACT AEPB Inhale 1 puff into the lungs daily.     Fluticasone-Umeclidin-Vilant (TRELEGY ELLIPTA) 100-62.5-25 MCG/INH AEPB Inhale 1 puff into the lungs daily. 3 each 3   ipratropium-albuterol (DUONEB) 0.5-2.5 (3) MG/3ML SOLN INHALE 3 MILLILITER EVERY 6 HOURS AS NEEDED FOR COPD EXACERBATION     montelukast (SINGULAIR) 10 MG tablet TAKE 1 TABLET BY MOUTH EVERYDAY AT BEDTIME 90 tablet 3   pantoprazole (PROTONIX) 40 MG tablet Take 1 tablet (40 mg total) by mouth daily before breakfast. 90 tablet 0   No current facility-administered medications for this visit.    Allergies  Allergen  Reactions   Codeine Nausea And Vomiting    Review of Systems:  Psychiatric/Behavioral: Has anxiety or depression     Physical Exam:    BP 114/70   Pulse 76   Ht 5\' 2"  (1.575 m)   Wt 99 lb 8 oz (45.1 kg)   BMI 18.20 kg/m  Wt Readings from Last 3 Encounters:  04/05/23 99 lb 8 oz (45.1 kg)  10/04/22 99 lb 6.4 oz (45.1 kg)  06/17/22 99 lb 3.2 oz (45 kg)   Constitutional:  Well-developed, in no acute distress. Psychiatric: Normal mood and affect. Behavior is normal. HEENT: Pupils normal.  Conjunctivae are normal. No scleral icterus. Neck supple.  Cardiovascular: Normal rate, regular rhythm. No edema Pulmonary/chest: Bilateral decreased breath sounds Abdominal: Soft, nondistended. Nontender. Bowel sounds active throughout. There are no masses palpable. No hepatomegaly. Rectal:  defered Neurological: Alert and oriented to person place and time. Skin: Skin is warm and dry. No rashes noted.  Data Reviewed: I have personally reviewed following labs and imaging studies  CBC:    Latest Ref Rng & Units 09/18/2019   12:08 PM 11/16/2018    8:44 AM  CBC  WBC 4.0 - 10.5 K/uL 7.0  9.1   Hemoglobin 12.0 - 15.0 g/dL 16.1  09.6   Hematocrit 36.0 - 46.0 % 36.2  39.5   Platelets 150.0 - 400.0 K/uL 328.0  411     CMP:    Latest Ref Rng & Units 09/18/2019   12:08 PM 02/14/2019    9:27 AM 11/16/2018    8:44 AM  CMP  Glucose 70 - 99 mg/dL 86  045  409   BUN 6 - 23 mg/dL 15  15  5    Creatinine 0.40 - 1.20 mg/dL 8.11  9.14  7.82   Sodium 135 - 145 mEq/L 136  139  139   Potassium 3.5 - 5.1 mEq/L 4.4  3.1  3.3   Chloride 96 - 112 mEq/L 101  103  100   CO2 19 - 32 mEq/L 28  29  27    Calcium 8.4 - 10.5 mg/dL 9.3  9.0  9.5   Total Protein 6.0 - 8.3 g/dL 7.1   7.3   Total Bilirubin 0.2 - 1.2 mg/dL 0.4   0.4   Alkaline Phos 39 - 117 U/L 73   76   AST 0 - 37 U/L 20   23   ALT 0 - 35 U/L 17   19       Edman Circle, MD 04/05/2023, 8:38 AM  Cc: Julianne Handler, NP

## 2023-04-05 NOTE — Patient Instructions (Addendum)
 _______________________________________________________  If your blood pressure at your visit was 140/90 or greater, please contact your primary care physician to follow up on this.  _______________________________________________________  If you are age 74 or older, your body mass index should be between 23-30. Your Body mass index is 18.2 kg/m. If this is out of the aforementioned range listed, please consider follow up with your Primary Care Provider.  If you are age 63 or younger, your body mass index should be between 19-25. Your Body mass index is 18.2 kg/m. If this is out of the aformentioned range listed, please consider follow up with your Primary Care Provider.   ________________________________________________________  The [facility] providers would like to encourage you to use [facility] to communicate with providers for non-urgent requests or questions.  Due to long hold times on the telephone, sending your provider a message by [facility] may be a faster and more efficient way to get a response.  Please allow 48 business hours for a response.  Please remember that this is for non-urgent requests.  _______________________________________________________  We have sent the following medications to your pharmacy for you to pick up at your convenience: Protonix  Small but more frequent meals  Thank you,  Dr. Lynann Bologna

## 2023-04-27 ENCOUNTER — Ambulatory Visit: Payer: Medicare HMO | Admitting: Pulmonary Disease

## 2023-04-27 ENCOUNTER — Encounter: Payer: Self-pay | Admitting: Pulmonary Disease

## 2023-04-27 VITALS — BP 156/92 | HR 72 | Ht 62.0 in | Wt 99.4 lb

## 2023-04-27 DIAGNOSIS — J449 Chronic obstructive pulmonary disease, unspecified: Secondary | ICD-10-CM | POA: Diagnosis not present

## 2023-04-27 DIAGNOSIS — R9389 Abnormal findings on diagnostic imaging of other specified body structures: Secondary | ICD-10-CM | POA: Diagnosis not present

## 2023-04-27 DIAGNOSIS — Z87891 Personal history of nicotine dependence: Secondary | ICD-10-CM

## 2023-04-27 DIAGNOSIS — R918 Other nonspecific abnormal finding of lung field: Secondary | ICD-10-CM

## 2023-04-27 MED ORDER — TRELEGY ELLIPTA 100-62.5-25 MCG/ACT IN AEPB: 1.0000 | INHALATION_SPRAY | Freq: Every day | RESPIRATORY_TRACT | 5 refills | Status: AC

## 2023-04-27 MED ORDER — ALBUTEROL SULFATE HFA 108 (90 BASE) MCG/ACT IN AERS: 2.0000 | INHALATION_SPRAY | RESPIRATORY_TRACT | 3 refills | Status: DC | PRN

## 2023-04-27 MED ORDER — AZITHROMYCIN 250 MG PO TABS: ORAL_TABLET | ORAL | 0 refills | Status: DC

## 2023-04-27 NOTE — Progress Notes (Signed)
 Amanda Cobb    [id number]    07/30/1950  Primary Care Physician:Routh, Garlan Fair, NP  Referring Physician: Julianne Handler, NP [address] [place],  [place] [address]  Chief complaint:   Patient being followed up for obstructive lung disease  HPI:  Patient with advanced chronic obstructive pulmonary disease  She feels relatively well, functioning well  Tries to stay active Exercises regularly  Compliant with current inhalers which include Trelegy 100, uses DuoNeb once in a while-rarely Does have albuterol to use as needed  Past history of smoking, quit in 2017 About a 50-pack-year smoking history  Her weight has been stable  Was being followed for lung nodule which has stayed stable to decrease in size  Has Gold 4 COPD  Currently takes azithromycin 3 days a week for maintenance Uses Trelegy daily Was previously on Symbicort  No ongoing concerns with her breathing, she is aware that it is advanced disease and tries to live with it as best as she can  No pertinent occupational history  She does have an occasional cough which is not limiting in any way  Outpatient Encounter Medications as of 04/27/2023  Medication Sig   albuterol (VENTOLIN HFA) 108 (90 Base) MCG/ACT inhaler INHALE 1 TO 2 PUFFS BY MOUTH EVERY 6 HOURS AS NEEDED FOR WHEEZING FOR SHORTNESS OF BREATH   alendronate (FOSAMAX) 70 MG tablet Take 70 mg by mouth once a week.   ALPRAZolam (XANAX) 0.5 MG tablet TAKE 1 & 1/2 TABLET BY MOUTH AT BEDTIME   atorvastatin (LIPITOR) 20 MG tablet Take 20 mg by mouth every other day.   azithromycin (ZITHROMAX) 250 MG tablet TAKE 1 TABLET BY MOUTH ONCE DAILY ON  MONDAY,  WEDNESDAY  AND  FRIDAY   calcium carbonate (OS-CAL - DOSED IN MG OF ELEMENTAL CALCIUM) 1250 (500 Ca) MG tablet Take 1 tablet by mouth.   dextromethorphan-guaiFENesin (MUCINEX DM) 30-600 MG 12hr tablet Take 1 tablet by mouth 2 (two) times daily as needed for cough.   escitalopram (LEXAPRO) 10 MG  tablet Take 10 mg by mouth daily.   fluticasone (FLONASE) 50 MCG/ACT nasal spray Place 1 spray into both nostrils 2 (two) times daily.   Fluticasone-Umeclidin-Vilant (TRELEGY ELLIPTA) 100-62.5-25 MCG/ACT AEPB Inhale 1 puff into the lungs daily.   Fluticasone-Umeclidin-Vilant (TRELEGY ELLIPTA) 100-62.5-25 MCG/INH AEPB Inhale 1 puff into the lungs daily.   ipratropium-albuterol (DUONEB) 0.5-2.5 (3) MG/3ML SOLN INHALE 3 MILLILITER EVERY 6 HOURS AS NEEDED FOR COPD EXACERBATION   montelukast (SINGULAIR) 10 MG tablet TAKE 1 TABLET BY MOUTH EVERYDAY AT BEDTIME   pantoprazole (PROTONIX) 40 MG tablet Take 1 tablet (40 mg total) by mouth daily before breakfast.   pantoprazole (PROTONIX) 40 MG tablet Take 1 tablet (40 mg total) by mouth daily.   No facility-administered encounter medications on file as of 04/27/2023.    Allergies as of 04/27/2023 - Review Complete 04/27/2023  Allergen Reaction Noted   Codeine Nausea And Vomiting 09/12/2016    Past Medical History:  Diagnosis Date   Allergic rhinitis    Anemia    Anxiety    COPD (chronic obstructive pulmonary disease) (HCC)    Crohn disease (HCC)    Esophageal dysphagia    Family history of colonic polyps    GERD (gastroesophageal reflux disease)    Hiatal hernia    History of colon polyps    History of Helicobacter pylori infection    Hyperlipidemia    Pituitary microadenoma (HCC)  RLS (restless legs syndrome)     Past Surgical History:  Procedure Laterality Date   COLONOSCOPY  07/21/2016   Colonic polup status post polypectomy. Few erosions and a small ulcer in the terminal ileum ? Importance- could represent Crohn's disease (biopsied)    ESOPHAGOGASTRODUODENOSCOPY  07/21/2016   Distal esophageal stricture status post esophageal dilatation. Small epiphrenic diverticulum. Small hiatal hernia. Incidental duodenal diverticulum   GALLBLADDER SURGERY     TONSILLECTOMY     TUBAL LIGATION      Family History  Problem Relation Age of  Onset   Dementia Mother    Colon polyps Mother    Asthma Father    COPD Sister    Depression Sister    Depression Sister    COPD Sister    Colon cancer Neg Hx    Esophageal cancer Neg Hx    Rectal cancer Neg Hx    Stomach cancer Neg Hx     Social History   Socioeconomic History   Marital status: Married    Spouse name: Not on file   Number of children: Not on file   Years of education: Not on file   Highest education level: Not on file  Occupational History   Not on file  Tobacco Use   Smoking status: Former    Current packs/day: 0.00    Average packs/day: 1 pack/day for 52.0 years (52.0 ttl pk-yrs)    Types: Cigarettes    Start date: 5    Quit date: 2017    Years since quitting: 8.2    Passive exposure: Past   Smokeless tobacco: Never  Vaping Use   Vaping status: Never Used  Substance and Sexual Activity   Alcohol use: Yes    Comment: Occasional, social   Drug use: Never   Sexual activity: Not on file  Other Topics Concern   Not on file  Social History Narrative   Not on file   Social Drivers of Health   Financial Resource Strain: Not on file  Food Insecurity: Not on file  Transportation Needs: Not on file  Physical Activity: Not on file  Stress: Not on file  Social Connections: Not on file  Intimate Partner Violence: Not on file    Review of Systems  Respiratory:  Positive for cough and shortness of breath.     Vitals:   04/27/23 0956  BP: (!) 156/92  Pulse: 72  SpO2: 95%     Physical Exam Constitutional:      Appearance: Normal appearance.  HENT:     Head: Normocephalic.     Nose: Nose normal.  Eyes:     Pupils: Pupils are equal, round, and reactive to light.  Cardiovascular:     Rate and Rhythm: Normal rate and regular rhythm.     Heart sounds: No murmur heard.    No friction rub.  Pulmonary:     Effort: No respiratory distress.     Breath sounds: No stridor. No wheezing or rhonchi.     Comments: Poor air movement bilaterally,  no added sounds Musculoskeletal:     Cervical back: No tenderness.  Neurological:     Mental Status: She is alert.  Psychiatric:        Mood and Affect: Mood normal.    Data Reviewed: Pulmonary function test was reviewed with the patient during the visit today-advanced disease in 2020  Most recent CT scan was reviewed with patient with extensive emphysema, lung nodule-right upper lobe nodule  Assessment:  Advanced chronic obstructive pulmonary disease  Gold 4 COPD  Stable lung nodule  Plan/Recommendations: Continue azithromycin 3 days a week  Continue Trelegy  Albuterol as needed  Follow-up CT in about a year-this will be ordered to be done about 5 to 6 months from here  Encouraged to stay active  Call us with significant concerns  I will see her 6 months from here   Virl Diamond MD [facility] 04/27/2023, 10:00 AM  CC: Julianne Handler, NP

## 2023-04-27 NOTE — Patient Instructions (Signed)
 Continue your inhalers  Continue to try to stay active  Continue azithromycin 3 days a week  Call us with significant concerns  Follow-up in 6 months  Your CAT scan is stable  Follow-up CT in a year

## 2023-05-09 ENCOUNTER — Telehealth: Payer: Self-pay | Admitting: *Deleted

## 2023-05-09 ENCOUNTER — Other Ambulatory Visit: Payer: Self-pay | Admitting: Pulmonary Disease

## 2023-05-09 MED ORDER — AZITHROMYCIN 250 MG PO TABS: ORAL_TABLET | ORAL | 0 refills | Status: AC

## 2023-05-09 MED ORDER — ALBUTEROL SULFATE HFA 108 (90 BASE) MCG/ACT IN AERS: 2.0000 | INHALATION_SPRAY | RESPIRATORY_TRACT | 3 refills | Status: AC | PRN

## 2023-05-09 NOTE — Telephone Encounter (Signed)
 Copied from CRM [id number]. Topic: Clinical - Medication Refill >> May 09, 2023  8:10 AM Isabell A wrote: Most Recent Primary Care Visit:   Medication: azithromycin (ZITHROMAX) 250 MG tablet  Has the patient contacted their pharmacy? Yes (Agent: If no, request that the patient contact the pharmacy for the refill. If patient does not wish to contact the pharmacy document the reason why and proceed with request.) (Agent: If yes, when and what did the pharmacy advise?)  Is this the correct pharmacy for this prescription? Yes If no, delete pharmacy and type the correct one.  This is the patient's preferred pharmacy:  [facility] [address] [place] - [address] [place] [address] Phone: [phone] Fax: [phone]   Has the prescription been filled recently? Yes  Is the patient out of the medication? Yes  Has the patient been seen for an appointment in the last year OR does the patient have an upcoming appointment? Yes  Can we respond through MyChart? No  Agent: Please be advised that Rx refills may take up to 3 business days. We ask that you follow-up with your pharmacy.

## 2023-05-09 NOTE — Telephone Encounter (Signed)
 Called [facility] pharmacy and verified that the patient called and requested that the Albuterol and Azithromycin be put on hold and not be filled.  They were sent in on 04/27/2023 by Dr. Wynona Neat.  I advised her that I would send it to a local pharmacy.  Nothing further needed.  Azithromycin and albuterol sent to [facility] pharmacy in [place].  Called and left detailed message per DPR that Azithromycin and Albuterol had been sent to [facility] pharmacy in [place] per request and cancelled the prescriptions sent to [facility] pharmacy.  Advised to call back with any questions.

## 2023-05-12 DIAGNOSIS — I700 Atherosclerosis of aorta: Secondary | ICD-10-CM | POA: Diagnosis not present

## 2023-05-12 DIAGNOSIS — R0982 Postnasal drip: Secondary | ICD-10-CM | POA: Diagnosis not present

## 2023-05-12 DIAGNOSIS — J432 Centrilobular emphysema: Secondary | ICD-10-CM | POA: Diagnosis not present

## 2023-05-12 DIAGNOSIS — F5104 Psychophysiologic insomnia: Secondary | ICD-10-CM | POA: Diagnosis not present

## 2023-05-12 DIAGNOSIS — M810 Age-related osteoporosis without current pathological fracture: Secondary | ICD-10-CM | POA: Diagnosis not present

## 2023-05-12 DIAGNOSIS — J309 Allergic rhinitis, unspecified: Secondary | ICD-10-CM | POA: Diagnosis not present

## 2023-05-12 DIAGNOSIS — J449 Chronic obstructive pulmonary disease, unspecified: Secondary | ICD-10-CM | POA: Diagnosis not present

## 2023-05-12 DIAGNOSIS — F411 Generalized anxiety disorder: Secondary | ICD-10-CM | POA: Diagnosis not present

## 2023-05-12 DIAGNOSIS — E782 Mixed hyperlipidemia: Secondary | ICD-10-CM | POA: Diagnosis not present

## 2023-06-08 DIAGNOSIS — H52223 Regular astigmatism, bilateral: Secondary | ICD-10-CM | POA: Diagnosis not present

## 2023-06-08 DIAGNOSIS — H18603 Keratoconus, unspecified, bilateral: Secondary | ICD-10-CM | POA: Diagnosis not present

## 2023-06-08 DIAGNOSIS — H5203 Hypermetropia, bilateral: Secondary | ICD-10-CM | POA: Diagnosis not present

## 2023-06-08 DIAGNOSIS — H524 Presbyopia: Secondary | ICD-10-CM | POA: Diagnosis not present

## 2023-06-08 DIAGNOSIS — H2513 Age-related nuclear cataract, bilateral: Secondary | ICD-10-CM | POA: Diagnosis not present

## 2023-06-08 DIAGNOSIS — H18712 Corneal ectasia, left eye: Secondary | ICD-10-CM | POA: Diagnosis not present

## 2023-06-29 DIAGNOSIS — J449 Chronic obstructive pulmonary disease, unspecified: Secondary | ICD-10-CM | POA: Diagnosis not present

## 2023-06-29 DIAGNOSIS — R059 Cough, unspecified: Secondary | ICD-10-CM | POA: Diagnosis not present

## 2023-06-29 DIAGNOSIS — R300 Dysuria: Secondary | ICD-10-CM | POA: Diagnosis not present

## 2023-06-29 DIAGNOSIS — R0982 Postnasal drip: Secondary | ICD-10-CM | POA: Diagnosis not present

## 2023-06-29 DIAGNOSIS — J309 Allergic rhinitis, unspecified: Secondary | ICD-10-CM | POA: Diagnosis not present

## 2023-07-16 DIAGNOSIS — J209 Acute bronchitis, unspecified: Secondary | ICD-10-CM | POA: Diagnosis not present

## 2023-07-20 DIAGNOSIS — J40 Bronchitis, not specified as acute or chronic: Secondary | ICD-10-CM | POA: Diagnosis not present

## 2023-07-20 DIAGNOSIS — J329 Chronic sinusitis, unspecified: Secondary | ICD-10-CM | POA: Diagnosis not present

## 2023-07-20 DIAGNOSIS — Z681 Body mass index (BMI) 19 or less, adult: Secondary | ICD-10-CM | POA: Diagnosis not present

## 2023-07-20 DIAGNOSIS — J449 Chronic obstructive pulmonary disease, unspecified: Secondary | ICD-10-CM | POA: Diagnosis not present

## 2023-07-28 ENCOUNTER — Ambulatory Visit: Payer: Self-pay

## 2023-07-28 MED ORDER — PREDNISONE 10 MG PO TABS: ORAL_TABLET | ORAL | 0 refills | Status: AC

## 2023-07-28 MED ORDER — DOXYCYCLINE HYCLATE 100 MG PO TABS: ORAL_TABLET | ORAL | 0 refills | Status: AC

## 2023-07-28 NOTE — Telephone Encounter (Addendum)
 FYI Only or Action Required?: Action required by provider: additional guidance/rx.  Patient is followed in Pulmonology for COPD, last seen on 04/27/2023 by Margaretann Sharper, MD. Called Nurse Triage reporting Shortness of Breath. Symptoms began several weeks ago. Interventions attempted: OTC medications: Mucinex, Prescription medications: abx/steroids, Maintenance inhaler, and Nebulizer treatments. Symptoms are: unchanged.  Triage Disposition: See Physician Within 24 Hours, Call Specialist Now  Patient/caregiver understands and will follow disposition?: No, wishes to speak with PCP     Copied from CRM [id number]. Topic: Clinical - Red Word Triage >> Jul 28, 2023  8:31 AM Justina Oman C wrote: Red Word that prompted transfer to Nurse Triage: Patient [phone] states cannot breath good, shortness of breath, and oxygen  level is running at 95-97. Patient denies wheezing, fever, dizziness nor vision issues. Patient went to pcp and urgent care within the last weeks and was given antibiotics shot, medications and steriods. Patient states no improvement. Please advise. Reason for Disposition  [1] MILD difficulty breathing (e.g., minimal/no SOB at rest, SOB with walking, pulse <100) AND [2] NEW-onset or WORSE than normal  Answer Assessment - Initial Assessment Questions E2C2 Pulmonary Triage - Initial Assessment Questions Chief Complaint (e.g., cough, sob, wheezing, fever, chills, sweat or additional symptoms) *Go to specific symptom protocol after initial questions. SOB, coughing, green L nasal discharge, chest tightness with exertion Reports went to UC with CXR (reports nothing showed up) and finished abx and steroid shot, then went to PCP and received prednisone  and abx shot - reports last day of abx last week  How long have symptoms been present? 2 weeks  Have you tested for COVID or Flu? Note: If not, ask patient if a home test can be taken. If so, instruct patient to call back for positive  results. No  MEDICINES:   Have you used any OTC meds to help with symptoms? Yes If yes, ask What medications? Mucinex daily - provides minimal relief  Have you used your inhalers/maintenance medication? Yes If yes, What medications? Trelegy - once a day Albuterol  NEB - twice a day  If inhaler, ask How many puffs and how often? Note: Review instructions on medication in the chart. See above  OXYGEN : Do you wear supplemental oxygen ? No If yes, How many liters are you supposed to use? N/a  Do you monitor your oxygen  levels? Yes If yes, What is your reading (oxygen  level) today? 95-97  What is your usual oxygen  saturation reading?  (Note: Pulmonary O2 sats should be 90% or greater) High 90s      3. PATTERN Does the difficult breathing come and go, or has it been constant since it started?      constant 4. SEVERITY: How bad is your breathing? (e.g., mild, moderate, severe)    - MILD: No SOB at rest, mild SOB with walking, speaks normally in sentences, can lie down, no retractions, pulse < 100.    - MODERATE: SOB at rest, SOB with minimal exertion and prefers to sit, cannot lie down flat, speaks in phrases, mild retractions, audible wheezing, pulse 100-120.    - SEVERE: Very SOB at rest, speaks in single words, struggling to breathe, sitting hunched forward, retractions, pulse > 120      Mild - SOB with exertion Triager does not appreciate audible SOB/wheezing during call. Pt is speaking in full sentences.  5. RECURRENT SYMPTOM: Have you had difficulty breathing before? If Yes, ask: When was the last time? and What happened that time?      No  6. CARDIAC HISTORY: Do you have any history of heart disease? (e.g., heart attack, angina, bypass surgery, angioplasty)      denies 7. LUNG HISTORY: Do you have any history of lung disease?  (e.g., pulmonary embolus, asthma, emphysema)     Bronchitis 8. CAUSE: What do you think is causing the breathing  problem?      Lung issue 9. OTHER SYMPTOMS: Do you have any other symptoms? (e.g., dizziness, runny nose, cough, chest pain, fever)     Chills - low grade fever Denies other sx 10. O2 SATURATION MONITOR:  Do you use an oxygen  saturation monitor (pulse oximeter) at home? If Yes, ask: What is your reading (oxygen  level) today? What is your usual oxygen  saturation reading? (e.g., 95%)       See above 11. PREGNANCY: Is there any chance you are pregnant? When was your last menstrual period?       N/a 12. TRAVEL: Have you traveled out of the country in the last month? (e.g., travel history, exposures)       N/a  Protocols used: Breathing Difficulty-A-AH

## 2023-07-28 NOTE — Telephone Encounter (Signed)
.    If the patient is already takien  antibiotic and prednisone  course then all I can do at this point.  Is to repeat the same OR she is to go to the ER OR an urgent care [here or somewhere else]   I am sending another round of prednisone  and antibiotic for her to try -> but when I did the prescription it said it will not be e-prescribed and instead it printed it so I do not know.  Please fix this   She has really bad COPD   Allergies  Allergen Reactions   Codeine Nausea And Vomiting      Latest Ref Rng & Units 12/11/2018   12:05 PM  PFT Results  FVC-Pre L 1.27   FVC-Predicted Pre % 46   FVC-Post L 1.52   FVC-Predicted Post % 56   Pre FEV1/FVC % % 38   Post FEV1/FCV % % 38   FEV1-Pre L 0.48   FEV1-Predicted Pre % 23   FEV1-Post L 0.58   DLCO uncorrected ml/min/mmHg 10.11   DLCO UNC% % 56   DLVA Predicted % 74   TLC L 4.86   TLC % Predicted % 104   RV % Predicted % 171

## 2023-07-28 NOTE — Telephone Encounter (Signed)
 Spoke with patient regarding prior message. Patient stated she will call next week and make a office will call next week to schedule with Sarah .  Patient's voice was understanding.Nothing else further needed.

## 2023-07-28 NOTE — Addendum Note (Signed)
 Addended by: Maire Scot on: 07/28/2023 12:36 PM   Modules accepted: Orders

## 2023-08-07 DIAGNOSIS — J441 Chronic obstructive pulmonary disease with (acute) exacerbation: Secondary | ICD-10-CM | POA: Diagnosis not present

## 2023-08-07 DIAGNOSIS — Z681 Body mass index (BMI) 19 or less, adult: Secondary | ICD-10-CM | POA: Diagnosis not present

## 2023-08-07 DIAGNOSIS — J449 Chronic obstructive pulmonary disease, unspecified: Secondary | ICD-10-CM | POA: Diagnosis not present

## 2023-08-29 DIAGNOSIS — D485 Neoplasm of uncertain behavior of skin: Secondary | ICD-10-CM | POA: Diagnosis not present

## 2023-09-12 ENCOUNTER — Telehealth: Payer: Self-pay | Admitting: Pulmonary Disease

## 2023-09-12 NOTE — Telephone Encounter (Signed)
 Hey Dr. Neda, patient has been scheduled for CT CHEST LUNG CA SCREEN LOW DOSE W/O CM. I see that there's an order for a CT Chest Wo Contrast. Can we cancel this order or would you still like to have it scheduled?

## 2023-09-14 NOTE — Telephone Encounter (Signed)
 We just need a follow-up CT to follow-up on the lung nodule so if one is scheduled already, just need to make sure she does not have multiple CTs performed

## 2023-09-15 NOTE — Telephone Encounter (Signed)
 Okay understood. The order has been canceled. Thank you!

## 2023-09-18 DIAGNOSIS — J441 Chronic obstructive pulmonary disease with (acute) exacerbation: Secondary | ICD-10-CM | POA: Diagnosis not present

## 2023-09-18 DIAGNOSIS — Z681 Body mass index (BMI) 19 or less, adult: Secondary | ICD-10-CM | POA: Diagnosis not present

## 2023-09-18 DIAGNOSIS — J449 Chronic obstructive pulmonary disease, unspecified: Secondary | ICD-10-CM | POA: Diagnosis not present

## 2023-10-05 ENCOUNTER — Ambulatory Visit
Admission: RE | Admit: 2023-10-05 | Discharge: 2023-10-05 | Disposition: A | Discharge status: Home / Self Care | Source: Ambulatory Visit | Attending: Acute Care | Admitting: Acute Care

## 2023-10-05 DIAGNOSIS — Z122 Encounter for screening for malignant neoplasm of respiratory organs: Secondary | ICD-10-CM

## 2023-10-05 DIAGNOSIS — Z87891 Personal history of nicotine dependence: Secondary | ICD-10-CM

## 2023-10-11 DIAGNOSIS — M79661 Pain in right lower leg: Secondary | ICD-10-CM | POA: Diagnosis not present

## 2023-10-11 DIAGNOSIS — S81811A Laceration without foreign body, right lower leg, initial encounter: Secondary | ICD-10-CM | POA: Diagnosis not present

## 2023-10-16 ENCOUNTER — Telehealth: Payer: Self-pay

## 2023-10-16 DIAGNOSIS — S81811A Laceration without foreign body, right lower leg, initial encounter: Secondary | ICD-10-CM | POA: Diagnosis not present

## 2023-10-16 DIAGNOSIS — L03115 Cellulitis of right lower limb: Secondary | ICD-10-CM | POA: Diagnosis not present

## 2023-10-16 NOTE — Telephone Encounter (Signed)
 Call Report from [facility]  IMPRESSION: Lung-RADS 4A, suspicious. Follow up low-dose chest CT without contrast in 3 months (please use the following order, CT CHEST LCS NODULE FOLLOW-UP W/O CM) is recommended. Multiple new lower lobe predominant pulmonary densities, favored to represent scarring. In the lateral left lower lobe, a component of subacute infection cannot be excluded. Consider antibiotic therapy prior to CT follow-up.   Incidental findings, including: Aortic atherosclerosis (ICD10-I70.0), coronary artery atherosclerosis and emphysema (ICD10-J43.9).   Left renal 5 mm lesion is similar to the prior exam and demonstrates complexity. Technically indeterminate but most likely a complex cyst. This could be re-evaluated on follow-up lung cancer screening CTs.

## 2023-10-17 NOTE — Telephone Encounter (Signed)
 Spoke with patient regarding CT results. Pt reports that several weeks prior to LDCT on 10/05/23 pt was sick with a respiratory illness. Pt reports that she was treated with antibiotics and steroids. Pt reports that she is feeling better. Only remaining symptom is an occasional non productive cough.  Also, pt is currently on Clindamycin 300 mg TID for a leg infection/ injury.  Advised pt that we will update Lauraine Lites, NP and will call her back to let her know the follow up plans. Pt verbalized understanding.

## 2023-10-18 ENCOUNTER — Other Ambulatory Visit: Payer: Self-pay

## 2023-10-18 DIAGNOSIS — R911 Solitary pulmonary nodule: Secondary | ICD-10-CM

## 2023-10-18 DIAGNOSIS — Z87891 Personal history of nicotine dependence: Secondary | ICD-10-CM

## 2023-10-18 DIAGNOSIS — Z122 Encounter for screening for malignant neoplasm of respiratory organs: Secondary | ICD-10-CM

## 2023-10-18 NOTE — Telephone Encounter (Signed)
 Lauraine Lites, NP has reviewed CT and recommends a 3 mth nodule CT and complete Clindamycin that was prescribed for leg infection.

## 2023-10-18 NOTE — Telephone Encounter (Signed)
 Spoke with patient by phone.  Advised per Lauraine Lites, NP instructions.  Patient will make sure to complete the antibiotic and agrees to repeat scan in 3 months.  She had no questions.  Patient instructed to let us  know in the event she had any symptoms of illness before repeating the next follow up CT.  Results and plan faxed to PCP

## 2023-10-18 NOTE — Telephone Encounter (Signed)
 LVM for patient to review provider reccomendations. LVM. 3 month follow up order placed.

## 2023-10-27 DIAGNOSIS — S81811A Laceration without foreign body, right lower leg, initial encounter: Secondary | ICD-10-CM | POA: Diagnosis not present

## 2023-11-01 DIAGNOSIS — S81801A Unspecified open wound, right lower leg, initial encounter: Secondary | ICD-10-CM | POA: Diagnosis not present

## 2023-11-03 DIAGNOSIS — S81811A Laceration without foreign body, right lower leg, initial encounter: Secondary | ICD-10-CM | POA: Diagnosis not present

## 2023-11-10 DIAGNOSIS — S81811A Laceration without foreign body, right lower leg, initial encounter: Secondary | ICD-10-CM | POA: Diagnosis not present

## 2023-11-24 DIAGNOSIS — S81811A Laceration without foreign body, right lower leg, initial encounter: Secondary | ICD-10-CM | POA: Diagnosis not present

## 2023-11-26 DIAGNOSIS — N1330 Unspecified hydronephrosis: Secondary | ICD-10-CM | POA: Diagnosis not present

## 2023-11-26 DIAGNOSIS — N281 Cyst of kidney, acquired: Secondary | ICD-10-CM | POA: Diagnosis not present

## 2023-11-27 DIAGNOSIS — J441 Chronic obstructive pulmonary disease with (acute) exacerbation: Secondary | ICD-10-CM | POA: Diagnosis not present

## 2023-11-27 DIAGNOSIS — J449 Chronic obstructive pulmonary disease, unspecified: Secondary | ICD-10-CM | POA: Diagnosis not present

## 2023-11-27 DIAGNOSIS — Z1231 Encounter for screening mammogram for malignant neoplasm of breast: Secondary | ICD-10-CM | POA: Diagnosis not present

## 2023-11-28 DIAGNOSIS — Z1231 Encounter for screening mammogram for malignant neoplasm of breast: Secondary | ICD-10-CM | POA: Diagnosis not present

## 2023-12-08 DIAGNOSIS — S81811A Laceration without foreign body, right lower leg, initial encounter: Secondary | ICD-10-CM | POA: Diagnosis not present

## 2024-01-01 DIAGNOSIS — M25552 Pain in left hip: Secondary | ICD-10-CM | POA: Diagnosis not present

## 2024-01-01 DIAGNOSIS — S7002XA Contusion of left hip, initial encounter: Secondary | ICD-10-CM | POA: Diagnosis not present

## 2024-01-08 ENCOUNTER — Inpatient Hospital Stay: Admission: RE | Admit: 2024-01-08 | Source: Ambulatory Visit

## 2024-01-10 DIAGNOSIS — J441 Chronic obstructive pulmonary disease with (acute) exacerbation: Secondary | ICD-10-CM | POA: Diagnosis not present

## 2024-01-10 DIAGNOSIS — J449 Chronic obstructive pulmonary disease, unspecified: Secondary | ICD-10-CM | POA: Diagnosis not present

## 2024-01-10 DIAGNOSIS — S7002XA Contusion of left hip, initial encounter: Secondary | ICD-10-CM | POA: Diagnosis not present

## 2024-01-12 ENCOUNTER — Inpatient Hospital Stay: Admission: RE | Admit: 2024-01-12 | Discharge: 2024-01-12 | Attending: Acute Care | Admitting: Acute Care

## 2024-01-12 DIAGNOSIS — R911 Solitary pulmonary nodule: Secondary | ICD-10-CM

## 2024-01-12 DIAGNOSIS — Z122 Encounter for screening for malignant neoplasm of respiratory organs: Secondary | ICD-10-CM

## 2024-01-12 DIAGNOSIS — Z87891 Personal history of nicotine dependence: Secondary | ICD-10-CM

## 2024-01-15 DIAGNOSIS — J441 Chronic obstructive pulmonary disease with (acute) exacerbation: Secondary | ICD-10-CM | POA: Diagnosis not present

## 2024-01-15 DIAGNOSIS — J449 Chronic obstructive pulmonary disease, unspecified: Secondary | ICD-10-CM | POA: Diagnosis not present

## 2024-01-15 DIAGNOSIS — S7002XA Contusion of left hip, initial encounter: Secondary | ICD-10-CM | POA: Diagnosis not present

## 2024-01-19 ENCOUNTER — Encounter: Payer: Self-pay | Admitting: Pulmonary Disease

## 2024-01-19 ENCOUNTER — Telehealth: Payer: Self-pay | Admitting: *Deleted

## 2024-01-19 ENCOUNTER — Telehealth: Payer: Self-pay

## 2024-01-19 ENCOUNTER — Ambulatory Visit: Admitting: Pulmonary Disease

## 2024-01-19 VITALS — BP 152/82 | HR 100 | Ht 62.0 in | Wt 94.0 lb

## 2024-01-19 DIAGNOSIS — Z8719 Personal history of other diseases of the digestive system: Secondary | ICD-10-CM | POA: Diagnosis not present

## 2024-01-19 DIAGNOSIS — J449 Chronic obstructive pulmonary disease, unspecified: Secondary | ICD-10-CM | POA: Diagnosis not present

## 2024-01-19 DIAGNOSIS — I2510 Atherosclerotic heart disease of native coronary artery without angina pectoris: Secondary | ICD-10-CM | POA: Diagnosis not present

## 2024-01-19 DIAGNOSIS — Z87891 Personal history of nicotine dependence: Secondary | ICD-10-CM

## 2024-01-19 DIAGNOSIS — R9389 Abnormal findings on diagnostic imaging of other specified body structures: Secondary | ICD-10-CM

## 2024-01-19 DIAGNOSIS — R911 Solitary pulmonary nodule: Secondary | ICD-10-CM

## 2024-01-19 MED ORDER — ENSIFENTRINE 3 MG/2.5ML IN SUSP: 3.0000 mg | Freq: Two times a day (BID) | RESPIRATORY_TRACT | 1 refills | Status: AC

## 2024-01-19 NOTE — Patient Instructions (Signed)
 Continue using your inhalers  Continue nebulization treatment  We will add Ensifentrine  to see if you benefit from using it  Follow-up in 6 months  Call us  with significant concerns

## 2024-01-19 NOTE — Progress Notes (Signed)
 Amanda Cobb    [id number]    Apr 05, 1950  Primary Care Physician:Routh, Lauraine DASEN, NP  Referring Physician: Benjamine Lauraine DASEN, NP [address] [place],  [place] [address]  Chief complaint:   Shortness of breath Chest discomfort   Discussed the use of AI scribe software for clinical note transcription with the patient, who gave verbal consent to proceed.  History of Present Illness Amanda Cobb is a 73 year old female with emphysema who presents with difficulty breathing and chest pain.  She experiences difficulty breathing, particularly in cold weather, and describes episodes where she feels like she 'can't breathe sometimes.' A recent episode required treatment with prednisone  and antibiotics, which provided some relief, but she still does not feel completely well.  Her chest pain is significant during these episodes of breathing difficulty. She is currently coughing up phlegm and is concerned about the progression of her lung disease and its impact on her daily life.  She has a history of emphysema and has undergone CT scans. She is currently using Trelegy and azithromycin  (Z-Pak) three days a week to manage her COPD symptoms.  She focuses on maintaining physical activity despite her condition. She was previously following a program but has not resumed it since her last illness. She also finds it challenging to maintain interest in certain foods despite nutritional advice to consume 50 grams of protein daily.  COPD with exacerbations She continues on azithromycin  3 days a week  Compliant with Trelegy Compliant with nebulization treatments  Still not functioning optimally  Talked about other options of treatment   Outpatient Encounter Medications as of 01/19/2024  Medication Sig   Ensifentrine  3 MG/2.5ML SUSP Inhale 3 mg into the lungs 2 (two) times daily.   sulfamethoxazole-trimethoprim (BACTRIM DS) 800-160 MG tablet SMARTSIG:1 Tablet(s) By Mouth Every 12 Hours    albuterol  (VENTOLIN  HFA) 108 (90 Base) MCG/ACT inhaler Inhale 2 puffs into the lungs every 4 (four) hours as needed for wheezing or shortness of breath.   alendronate (FOSAMAX) 70 MG tablet Take 70 mg by mouth once a week.   ALPRAZolam (XANAX) 0.5 MG tablet TAKE 1 & 1/2 TABLET BY MOUTH AT BEDTIME   atorvastatin (LIPITOR) 20 MG tablet Take 20 mg by mouth every other day.   azithromycin  (ZITHROMAX ) 250 MG tablet TAKE 1 TABLET BY MOUTH ONCE DAILY ON  MONDAY,  WEDNESDAY  AND  FRIDAY   calcium carbonate (OS-CAL - DOSED IN MG OF ELEMENTAL CALCIUM) 1250 (500 Ca) MG tablet Take 1 tablet by mouth.   dextromethorphan-guaiFENesin (MUCINEX DM) 30-600 MG 12hr tablet Take 1 tablet by mouth 2 (two) times daily as needed for cough.   doxycycline  (VIBRA -TABS) 100 MG tablet Take doxycycline  100mg  po twice daily x 5 days; take after meals and avoid sunlight   escitalopram (LEXAPRO) 10 MG tablet Take 10 mg by mouth daily.   fluticasone (FLONASE) 50 MCG/ACT nasal spray Place 1 spray into both nostrils 2 (two) times daily.   Fluticasone-Umeclidin-Vilant (TRELEGY ELLIPTA ) 100-62.5-25 MCG/ACT AEPB Inhale 1 puff into the lungs daily.   ipratropium-albuterol  (DUONEB) 0.5-2.5 (3) MG/3ML SOLN INHALE 3 MILLILITER EVERY 6 HOURS AS NEEDED FOR COPD EXACERBATION   montelukast  (SINGULAIR ) 10 MG tablet TAKE 1 TABLET BY MOUTH EVERYDAY AT BEDTIME   pantoprazole  (PROTONIX ) 40 MG tablet Take 1 tablet (40 mg total) by mouth daily before breakfast.   pantoprazole  (PROTONIX ) 40 MG tablet Take 1 tablet (40 mg total) by mouth daily.   predniSONE  (  DELTASONE ) 10 MG tablet Please take prednisone  40 mg x1 day, then 30 mg x1 day, then 20 mg x1 day, then 10 mg x1 day, and then 5 mg x1 day and stop   No facility-administered encounter medications on file as of 01/19/2024.    Allergies as of 01/19/2024 - Review Complete 01/19/2024  Allergen Reaction Noted   Codeine Nausea And Vomiting 09/12/2016    Past Medical History:  Diagnosis Date    Allergic rhinitis    Anemia    Anxiety    COPD (chronic obstructive pulmonary disease) (HCC)    Crohn disease (HCC)    Esophageal dysphagia    Family history of colonic polyps    GERD (gastroesophageal reflux disease)    Hiatal hernia    History of colon polyps    History of Helicobacter pylori infection    Hyperlipidemia    Pituitary microadenoma (HCC)    RLS (restless legs syndrome)     Past Surgical History:  Procedure Laterality Date   COLONOSCOPY  07/21/2016   Colonic polup status post polypectomy. Few erosions and a small ulcer in the terminal ileum ? Importance- could represent Crohn's disease (biopsied)    ESOPHAGOGASTRODUODENOSCOPY  07/21/2016   Distal esophageal stricture status post esophageal dilatation. Small epiphrenic diverticulum. Small hiatal hernia. Incidental duodenal diverticulum   GALLBLADDER SURGERY     TONSILLECTOMY     TUBAL LIGATION      Family History  Problem Relation Age of Onset   Dementia Mother    Colon polyps Mother    Asthma Father    COPD Sister    Depression Sister    Depression Sister    COPD Sister    Colon cancer Neg Hx    Esophageal cancer Neg Hx    Rectal cancer Neg Hx    Stomach cancer Neg Hx     Social History   Socioeconomic History   Marital status: Married    Spouse name: Not on file   Number of children: Not on file   Years of education: Not on file   Highest education level: Not on file  Occupational History   Not on file  Tobacco Use   Smoking status: Former    Current packs/day: 0.00    Average packs/day: 1 pack/day for 52.0 years (52.0 ttl pk-yrs)    Types: Cigarettes    Start date: 62    Quit date: 2017    Years since quitting: 8.9    Passive exposure: Past   Smokeless tobacco: Never  Vaping Use   Vaping status: Never Used  Substance and Sexual Activity   Alcohol use: Yes    Comment: Occasional, social   Drug use: Never   Sexual activity: Not on file  Other Topics Concern   Not on file  Social  History Narrative   Not on file   Social Drivers of Health   Tobacco Use: Medium Risk (01/19/2024)   Patient History    Smoking Tobacco Use: Former    Smokeless Tobacco Use: Never    Passive Exposure: Past  Programmer, 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   Psychiatric/Behavioral:  Positive for sleep disturbance.     Vitals:   01/19/24 1054  BP: (!) 152/82  Pulse: 100  SpO2: 93%     Physical Exam Constitutional:      Appearance: Normal appearance.  HENT:     Mouth/Throat:     Mouth: Mucous membranes are moist.  Eyes:     General: No scleral icterus. Cardiovascular:     Rate and Rhythm: Normal rate and regular rhythm.     Heart sounds: No murmur heard.    No friction rub.  Pulmonary:     Effort: No respiratory distress.     Breath sounds: No stridor. No wheezing or rhonchi.     Comments: Decreased air movement bilaterally Musculoskeletal:     Cervical back: No rigidity or tenderness.  Neurological:     Mental Status: She is alert.  Psychiatric:        Mood and Affect: Mood normal.    Data Reviewed: CT scan reviewed showing fleeting nodules - CT reviewed with patient and compared with previous  Assessment and Plan Assessment & Plan Chronic obstructive pulmonary disease with emphysema Chronic obstructive pulmonary disease with emphysema, well-managed on CT scan with transient spots not indicative of malignancy. Symptoms include dyspnea, especially in cold weather, and chest pain during exacerbations. Current treatment includes Trelegy and azithromycin  three times a  week. Discussed potential addition of encephalitis nebulizer treatment to manage symptoms. Emphasized importance of physical activity to maintain muscle strength and diaphragm function. Discussed nutritional needs, including protein intake, to support energy levels. Explained that injectables are an option for frequent bronchitis but require monthly commitment and may not completely eliminate symptoms. - Continue Trelegy and azithromycin  three times a week. - Initiated encephalitis nebulizer treatment twice daily for four to eight weeks to assess efficacy. - Encouraged regular physical activity to maintain muscle strength and diaphragm function. - Advised on maintaining adequate nutrition, including protein intake, to support energy levels.  Advanced chronic obstructive pulmonary disease  Stage IV COPD  On Trelegy, azithromycin  3 days a week  Discussed other options of treatment - Will add Ensifentrine  to see if he benefits from this  Follow-up in about 6 months  Encouraged to call with significant concerns  Coronary artery disease - Stable symptoms  History of Crohn's - Stable symptoms  No orders of the defined types were placed in this encounter.     Jennet Epley MD [facility] 01/19/2024, 11:20 AM  CC: Benjamine Lauraine DASEN, NP

## 2024-01-19 NOTE — Telephone Encounter (Signed)
 Dr Milagros recommends a 3 month follow up CT. Will set a reminder to contact pt before scheduling scan

## 2024-01-19 NOTE — Telephone Encounter (Signed)
 Plan is to contact pt prior to having repeat scan to make sure she is asymptomatic before proceeding with scan.

## 2024-01-19 NOTE — Telephone Encounter (Signed)
 Pt is in the office now seeing Dr. Neda. Will follow for plan.

## 2024-01-19 NOTE — Telephone Encounter (Signed)
 Error

## 2024-01-19 NOTE — Telephone Encounter (Signed)
 Call report from [facility]:  IMPRESSION: 1. Waxing and waning areas of nodular consolidation. Lung-RADS 0, incomplete. Recommend follow-up low-dose lung cancer screening CT in 3 months, after appropriate clinical therapy. Patient may be a poor candidate for lung cancer screening. These results will be called to the ordering clinician or representative by the Radiologist Assistant, and communication documented in the PACS or [employer]. 2. Aortic atherosclerosis (ICD10-I70.0). Coronary artery calcification. 3. Enlarged pulmonic trunk, indicative of pulmonary arterial hypertension. 4.  Emphysema (ICD10-J43.9).

## 2024-01-23 ENCOUNTER — Telehealth: Payer: Self-pay

## 2024-01-23 NOTE — Telephone Encounter (Signed)
Amanda Cobb

## 2024-01-23 NOTE — Telephone Encounter (Signed)
 Received new start paperwork in pharmacy team fax. Opening benefits investigation in this thread.

## 2024-01-30 NOTE — Telephone Encounter (Signed)
 Received [employer]  new start paperwork. Completed form and faxed with clinicals and insurance card copy to [facility]   Phone#: [phone] Fax#: [phone]

## 2024-01-31 NOTE — Telephone Encounter (Signed)
 Received fax from [employer] confirming receipt of enrollment form.  Patient ID: [id number]

## 2024-02-06 ENCOUNTER — Other Ambulatory Visit: Payer: Self-pay | Admitting: Pulmonary Disease

## 2024-02-06 DIAGNOSIS — R9389 Abnormal findings on diagnostic imaging of other specified body structures: Secondary | ICD-10-CM

## 2024-02-13 NOTE — Telephone Encounter (Signed)
 Received fax that [employer]  has been triaged to  [facility] (phone: [phone])

## 2024-05-06 ENCOUNTER — Other Ambulatory Visit

## 2024-07-19 ENCOUNTER — Ambulatory Visit: Admitting: Pulmonary Disease
